# Patient Record
Sex: Male | Born: 1960 | Race: White | Hispanic: No | Marital: Married | Smoking: Former smoker
Health system: Southern US, Community
[De-identification: ages and names within clinical notes are randomized; demographics above are authoritative.]

## PROBLEM LIST (undated history)

## (undated) DIAGNOSIS — G20 Parkinson's disease: Secondary | ICD-10-CM

## (undated) DIAGNOSIS — G4719 Other hypersomnia: Secondary | ICD-10-CM

## (undated) DIAGNOSIS — M5442 Lumbago with sciatica, left side: Secondary | ICD-10-CM

## (undated) DIAGNOSIS — R251 Tremor, unspecified: Secondary | ICD-10-CM

## (undated) DIAGNOSIS — G4700 Insomnia, unspecified: Secondary | ICD-10-CM

## (undated) DIAGNOSIS — G20A1 Parkinson's disease without dyskinesia, without mention of fluctuations: Secondary | ICD-10-CM

## (undated) DIAGNOSIS — G2581 Restless legs syndrome: Secondary | ICD-10-CM

## (undated) DIAGNOSIS — G4733 Obstructive sleep apnea (adult) (pediatric): Secondary | ICD-10-CM

## (undated) HISTORY — DX: Obstructive sleep apnea (adult) (pediatric): G47.33

## (undated) HISTORY — DX: Parkinson's disease: G20

## (undated) HISTORY — DX: Other hypersomnia: G47.19

## (undated) HISTORY — DX: Restless legs syndrome: G25.81

## (undated) HISTORY — DX: Parkinson's disease without dyskinesia, without mention of fluctuations: G20.A1

## (undated) HISTORY — DX: Insomnia, unspecified: G47.00

## (undated) HISTORY — PX: APPENDECTOMY: SHX54

## (undated) HISTORY — DX: Tremor, unspecified: R25.1

## (undated) HISTORY — DX: Lumbago with sciatica, left side: M54.42

---

## 1995-02-26 HISTORY — PX: APPENDECTOMY: SHX54

## 2000-07-19 ENCOUNTER — Emergency Department (HOSPITAL_COMMUNITY): Admission: EM | Admit: 2000-07-19 | Discharge: 2000-07-19 | Payer: Self-pay | Admitting: *Deleted

## 2011-02-25 ENCOUNTER — Encounter: Payer: Self-pay | Admitting: Cardiovascular Disease

## 2011-06-10 ENCOUNTER — Emergency Department (HOSPITAL_COMMUNITY): Payer: Managed Care, Other (non HMO)

## 2011-06-10 ENCOUNTER — Emergency Department (HOSPITAL_COMMUNITY)
Admission: EM | Admit: 2011-06-10 | Discharge: 2011-06-10 | Disposition: A | Discharge status: Home / Self Care | Payer: Managed Care, Other (non HMO) | Attending: Emergency Medicine | Admitting: Emergency Medicine

## 2011-06-10 ENCOUNTER — Encounter (HOSPITAL_COMMUNITY): Payer: Self-pay

## 2011-06-10 DIAGNOSIS — R002 Palpitations: Secondary | ICD-10-CM | POA: Insufficient documentation

## 2011-06-10 DIAGNOSIS — R079 Chest pain, unspecified: Secondary | ICD-10-CM | POA: Insufficient documentation

## 2011-06-10 DIAGNOSIS — F17200 Nicotine dependence, unspecified, uncomplicated: Secondary | ICD-10-CM | POA: Insufficient documentation

## 2011-06-10 LAB — BASIC METABOLIC PANEL
BUN: 16 mg/dL (ref 6–23)
CO2: 26 mEq/L (ref 19–32)
Chloride: 104 mEq/L (ref 96–112)
Creatinine, Ser: 0.95 mg/dL (ref 0.50–1.35)
GFR calc Af Amer: >90 mL/min (ref >90)

## 2011-06-10 LAB — DIFFERENTIAL
Basophils Relative: 0 % (ref 0–1)
Monocytes Absolute: 0.5 10*3/uL (ref 0.1–1.0)
Monocytes Relative: 8 % (ref 3–12)
Neutro Abs: 4.5 10*3/uL (ref 1.7–7.7)

## 2011-06-10 LAB — CBC
HCT: 42.7 % (ref 39.0–52.0)
Hemoglobin: 15.3 g/dL (ref 13.0–17.0)
MCH: 30 pg (ref 26.0–34.0)
MCHC: 35.8 g/dL (ref 30.0–36.0)
MCV: 83.7 fL (ref 78.0–100.0)

## 2011-06-10 NOTE — Discharge Instructions (Signed)
Your ecg and blood tests do not show any signs of a heart attack or irregular heart beat.  Use tylenol or motrin for pain.  FOllow up with Dr. Tenny Craw for reevaluation. Call for appointment time. Return for worse symptoms.

## 2011-06-10 NOTE — ED Provider Notes (Addendum)
History     CSN: [id number]  Arrival date & time 06/10/11  1142   First MD Initiated Contact with Patient 06/10/11 1305      Chief Complaint  Patient presents with  . Chest Pain    (Consider location/radiation/quality/duration/timing/severity/associated sxs/prior treatment) HPI The patient is a 51 year old, male, with no past medical history presents to the emergency department complaining of left sided chest pain.  He says that it comes and goes, and fluctuates in severity.  It began while he was sitting at a computer.  He says occasionally it seems to radiate up into his neck.  However, he denies shortness of breath, nausea, vomiting, sweating.  He has not had a cough, fevers, chills, leg pain or swelling.  He does not take any medications.  He does not smoke cigarettes.  He states that he exercises aerobically 4-5 times per week and he does not get chest pain.  His pain is 1/10 at this time.  History reviewed. No pertinent past medical history.  History reviewed. No pertinent past surgical history.  No family history on file.  History  Substance Use Topics  . Smoking status: Current Some Day Smoker    Types: Cigars  . Smokeless tobacco: Not on file  . Alcohol Use: No      Review of Systems  Constitutional: Negative for fever, chills and diaphoresis.  Respiratory: Negative for cough and shortness of breath.   Cardiovascular: Positive for chest pain and palpitations. Negative for leg swelling.  Gastrointestinal: Negative for nausea and vomiting.  All other systems reviewed and are negative.    Allergies  Review of patient's allergies indicates no known allergies.  Home Medications   Current Outpatient Rx  Name Route Sig Dispense Refill  . RISAQUAD PO CAPS Oral Take 1 capsule by mouth daily.    Marland Kitchen MELATONIN 3 MG PO TABS Oral Take 3 mg by mouth at bedtime.    . MULTI-VITAMIN/MINERALS PO TABS Oral Take 1 tablet by mouth daily.      BP 139/91  Pulse 65  Temp(Src)  98.1 F (36.7 C) (Oral)  Resp 18  SpO2 95%  Physical Exam  Vitals reviewed. Constitutional: He is oriented to person, place, and time. He appears well-developed and well-nourished.  HENT:  Head: Normocephalic and atraumatic.  Eyes: Conjunctivae are normal.  Neck: Normal range of motion. Neck supple.       No carotid bruits  Cardiovascular: Normal rate.   No murmur heard. Pulmonary/Chest: Effort normal and breath sounds normal. No respiratory distress.  Abdominal: Soft. Bowel sounds are normal. There is no tenderness.  Musculoskeletal: Normal range of motion. He exhibits no edema.  Neurological: He is alert and oriented to person, place, and time.  Skin: Skin is warm and dry.  Psychiatric: He has a normal mood and affect. Thought content normal.    ED Course  Procedures (including critical care time) 51 year old, male, with no significant past medical history presents with left-sided chest pain, but no other symptoms.  Typically, associated with acute ACS.  We'll perform EKG, and laboratory testing, and chest x-ray, for evaluation because of his age and the pain.  However, I doubt that he is having in acute coronary syndrome.  At this time.   Labs Reviewed  CBC  DIFFERENTIAL  BASIC METABOLIC PANEL  POCT I-STAT TROPONIN I   Dg Chest Portable 1 View  06/10/2011  *RADIOLOGY REPORT*  Clinical Data: Left-sided chest pain  PORTABLE CHEST - 1 VIEW  Comparison: None.  Findings: Artifact overlies the chest.  Heart size is normal. Mediastinal shadows are normal.  Lungs are clear.  No effusions. No bony abnormalities.  IMPRESSION: No active disease  Original Report Authenticated By: Thomasenia Sales, M.D.    Date: 06/10/2011  Rate: 63  Rhythm: normal sinus rhythm  QRS Axis: normal  Intervals: normal  ST/T Wave abnormalities: normal  Conduction Disutrbances: none  Narrative Interpretation: unremarkable     2:11 PM I spoke with Dr. Tenny Craw.  He agreed with discharge.  He will see pt in  office.    I explained plan to pt.  He understands and agrees.  MDM  Chest pain No evidence of acute coronary syndrome, pulmonary disease, or other acute illness.        Cheri Guppy, MD 06/10/11 1428  Cheri Guppy, MD 06/10/11 [phone]

## 2011-06-10 NOTE — ED Notes (Signed)
Patient reports that he developed chestpain while sitting at desk 1 hour pta-weakness with radiation to back and jaw.

## 2011-09-19 ENCOUNTER — Other Ambulatory Visit: Payer: Self-pay | Admitting: Family Medicine

## 2011-09-19 DIAGNOSIS — R229 Localized swelling, mass and lump, unspecified: Secondary | ICD-10-CM

## 2011-09-20 ENCOUNTER — Ambulatory Visit
Admission: RE | Admit: 2011-09-20 | Discharge: 2011-09-20 | Disposition: A | Discharge status: Home / Self Care | Payer: Managed Care, Other (non HMO) | Source: Ambulatory Visit | Attending: Family Medicine | Admitting: Family Medicine

## 2011-09-20 DIAGNOSIS — R229 Localized swelling, mass and lump, unspecified: Secondary | ICD-10-CM

## 2011-09-27 ENCOUNTER — Ambulatory Visit
Admission: RE | Admit: 2011-09-27 | Discharge: 2011-09-27 | Disposition: A | Discharge status: Home / Self Care | Payer: Managed Care, Other (non HMO) | Source: Ambulatory Visit | Attending: Family Medicine | Admitting: Family Medicine

## 2011-09-27 ENCOUNTER — Other Ambulatory Visit: Payer: Self-pay | Admitting: Family Medicine

## 2011-09-27 DIAGNOSIS — R229 Localized swelling, mass and lump, unspecified: Secondary | ICD-10-CM

## 2011-09-27 MED ORDER — IOHEXOL 300 MG/ML  SOLN
100.0000 mL | Freq: Once | INTRAMUSCULAR | Status: AC | PRN
  Administered 2011-09-27: 100 mL via INTRAVENOUS

## 2013-09-08 ENCOUNTER — Encounter (INDEPENDENT_AMBULATORY_CARE_PROVIDER_SITE_OTHER): Payer: Self-pay

## 2013-09-08 ENCOUNTER — Ambulatory Visit
Admission: RE | Admit: 2013-09-08 | Discharge: 2013-09-08 | Disposition: A | Discharge status: Home / Self Care | Payer: 59 | Source: Ambulatory Visit | Attending: Family Medicine | Admitting: Family Medicine

## 2013-09-08 ENCOUNTER — Other Ambulatory Visit: Payer: Self-pay | Admitting: Family Medicine

## 2013-09-08 DIAGNOSIS — M542 Cervicalgia: Secondary | ICD-10-CM

## 2013-09-13 ENCOUNTER — Other Ambulatory Visit: Payer: Self-pay | Admitting: Family Medicine

## 2013-09-13 DIAGNOSIS — M542 Cervicalgia: Secondary | ICD-10-CM

## 2013-09-17 ENCOUNTER — Ambulatory Visit
Admission: RE | Admit: 2013-09-17 | Discharge: 2013-09-17 | Disposition: A | Discharge status: Home / Self Care | Payer: 59 | Source: Ambulatory Visit | Attending: Family Medicine | Admitting: Family Medicine

## 2013-09-17 DIAGNOSIS — M542 Cervicalgia: Secondary | ICD-10-CM

## 2014-05-12 ENCOUNTER — Encounter (HOSPITAL_BASED_OUTPATIENT_CLINIC_OR_DEPARTMENT_OTHER): Payer: Self-pay | Admitting: *Deleted

## 2014-05-12 ENCOUNTER — Emergency Department: Payer: 59

## 2014-05-12 ENCOUNTER — Emergency Department (HOSPITAL_BASED_OUTPATIENT_CLINIC_OR_DEPARTMENT_OTHER): Payer: 59

## 2014-05-12 ENCOUNTER — Emergency Department (HOSPITAL_BASED_OUTPATIENT_CLINIC_OR_DEPARTMENT_OTHER)
Admission: EM | Admit: 2014-05-12 | Discharge: 2014-05-13 | Disposition: A | Discharge status: Home / Self Care | Payer: 59 | Attending: Emergency Medicine | Admitting: Emergency Medicine

## 2014-05-12 DIAGNOSIS — R42 Dizziness and giddiness: Secondary | ICD-10-CM | POA: Diagnosis not present

## 2014-05-12 LAB — BASIC METABOLIC PANEL
ANION GAP: 8 (ref 5–15)
BUN: 20 mg/dL (ref 6–23)
CO2: 28 mmol/L (ref 19–32)
CREATININE: 0.94 mg/dL (ref 0.50–1.35)
Calcium: 9 mg/dL (ref 8.4–10.5)
Chloride: 106 mmol/L (ref 96–112)
GFR calc Af Amer: >90 mL/min (ref >90)
GLUCOSE: 108 mg/dL — AB (ref 70–99)
Potassium: 3.4 mmol/L — ABNORMAL LOW (ref 3.5–5.1)
SODIUM: 142 mmol/L (ref 135–145)

## 2014-05-12 LAB — CBC WITH DIFFERENTIAL/PLATELET
BASOS PCT: 0 % (ref 0–1)
Basophils Absolute: 0 10*3/uL (ref 0.0–0.1)
EOS ABS: 0.1 10*3/uL (ref 0.0–0.7)
EOS PCT: 2 % (ref 0–5)
HCT: 41 % (ref 39.0–52.0)
HEMOGLOBIN: 14.7 g/dL (ref 13.0–17.0)
Lymphocytes Relative: 25 % (ref 12–46)
Lymphs Abs: 1.7 10*3/uL (ref 0.7–4.0)
MCH: 30.4 pg (ref 26.0–34.0)
MCHC: 35.9 g/dL (ref 30.0–36.0)
MCV: 84.7 fL (ref 78.0–100.0)
MONO ABS: 0.5 10*3/uL (ref 0.1–1.0)
Monocytes Relative: 8 % (ref 3–12)
NEUTROS PCT: 65 % (ref 43–77)
Neutro Abs: 4.5 10*3/uL (ref 1.7–7.7)
Platelets: 238 10*3/uL (ref 150–400)
RBC: 4.84 MIL/uL (ref 4.22–5.81)
RDW: 12.8 % (ref 11.5–15.5)
WBC: 6.9 10*3/uL (ref 4.0–10.5)

## 2014-05-12 LAB — TROPONIN I: Troponin I: <0.03 ng/mL (ref <0.031)

## 2014-05-12 MED ORDER — MECLIZINE HCL 25 MG PO TABS
25.0000 mg | ORAL_TABLET | Freq: Once | ORAL | Status: AC
  Administered 2014-05-12: 25 mg via ORAL
  Filled 2014-05-12: qty 1

## 2014-05-12 NOTE — ED Notes (Signed)
Pt placed on heart monitor.

## 2014-05-12 NOTE — ED Notes (Signed)
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

## 2014-05-12 NOTE — ED Provider Notes (Signed)
CSN: [id number]     Arrival date & time 05/12/14  1608 History  This chart was scribed for Rolan BuccoMelanie Lars Jeziorski, MD by Evon Slackerrance Branch, ED Scribe. This patient was seen in room MH03/MH03 and the patient's care was started at 6:35 PM.      Chief Complaint  Patient presents with  . Dizziness   Patient is a 54 y.o. male presenting with dizziness. The history is provided by the patient. No language interpreter was used.  Dizziness Associated symptoms: headaches and nausea   Associated symptoms: no blood in stool, no chest pain, no diarrhea, no shortness of breath, no vomiting and no weakness    HPI Comments: Donnamarie Rossettihomas J Fritsch is a 54 y.o. male who presents to the Emergency Department complaining of new dizziness onset today at 2:30 PM. Pt describes the dizziness as the room as spinning and an off balance feeling. Pt states that he has an slight occipital and parietal HA and slight nausea. Pt states that he had some slight neck pain described as a discomfort/ 2 days that resolved on its own. 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 Pt states that turning his head or standing makes the dizziness worse. Pt states that he has had some facial twitching to the right face that has been going off on for the last few days, but he has had that before during stressful situations.  Pt denies falls or head injuries. Pt denies numbness, vision changes or vomiting. Pt states that he has a Hx of seasonal allergies. Denies being on blood thinners.   History reviewed. No pertinent past medical history. Past Surgical History  Procedure Laterality Date  . Appendectomy     No family history on file. History  Substance Use Topics  . Smoking status: Current Some Day Smoker    Types: Cigars  . Smokeless tobacco: Current User    Types: Snuff  . Alcohol Use: No    Review of Systems  Constitutional: Negative for fever, chills, diaphoresis and fatigue.  HENT: Negative for congestion, rhinorrhea and sneezing.    Eyes: Negative.  Negative for visual disturbance.  Respiratory: Negative for cough, chest tightness and shortness of breath.   Cardiovascular: Negative for chest pain and leg swelling.  Gastrointestinal: Positive for nausea. Negative for vomiting, abdominal pain, diarrhea and blood in stool.  Genitourinary: Negative for frequency, hematuria, flank pain and difficulty urinating.  Musculoskeletal: Negative for back pain and arthralgias.  Skin: Negative for rash.  Neurological: Positive for dizziness and headaches. Negative for speech difficulty, weakness and numbness.  All other systems reviewed and are negative.   Allergies  Review of patient's allergies indicates no known allergies.  Home Medications   Prior to Admission medications   Medication Sig Start Date End Date Taking? Authorizing Provider  naproxen sodium (ANAPROX) 220 MG tablet Take 220 mg by mouth as needed.   Yes Historical Provider, MD  acidophilus (RISAQUAD) CAPS Take 1 capsule by mouth daily.    Historical Provider, MD  Melatonin 3 MG TABS Take 3 mg by mouth at bedtime.    Historical Provider, MD  Multiple Vitamins-Minerals (MULTIVITAMIN WITH MINERALS) tablet Take 1 tablet by mouth daily.    Historical Provider, MD   BP 135/79 mmHg  Pulse 72  Temp(Src) 98.4 F (36.9 C) (Oral)  Resp 18  Ht 5\' 9"  (1.753 m)  Wt 190 lb (86.183 kg)  BMI 28.05 kg/m2  SpO2 99%   Physical Exam  Constitutional: He is oriented to person, place,  and time. He appears well-developed and well-nourished.  HENT:  Head: Normocephalic and atraumatic.  Right Ear: External ear normal.  Left Ear: External ear normal.  Mouth/Throat: Oropharynx is clear and moist.  Eyes: Pupils are equal, round, and reactive to light.  Positive horizontal nystagmus with fast component to the left. No vertical or rotational nystagmus  Neck: Normal range of motion. Neck supple.  Cardiovascular: Normal rate, regular rhythm and normal heart sounds.    Pulmonary/Chest: Effort normal and breath sounds normal. No respiratory distress. He has no wheezes. He has no rales. He exhibits no tenderness.  Abdominal: Soft. Bowel sounds are normal. There is no tenderness. There is no rebound and no guarding.  Musculoskeletal: Normal range of motion. He exhibits no edema.  Lymphadenopathy:    He has no cervical adenopathy.  Neurological: He is alert and oriented to person, place, and time.  Motor 5 out of 5 all extremities, sensation grossly intact to light touch all extremities, finger-to-nose intact, no pronator drift, CN 2 through 12 grossly intact  Skin: Skin is warm and dry. No rash noted.  Psychiatric: He has a normal mood and affect.    ED Course  Procedures (including critical care time) DIAGNOSTIC STUDIES: Oxygen Saturation is 99% on RA, normal by my interpretation.    COORDINATION OF CARE: 6:46 PM-Discussed treatment plan with pt at bedside and pt agreed to plan.     Labs Review Labs Reviewed  BASIC METABOLIC PANEL - Abnormal; Notable for the following:    Potassium 3.4 (*)    Glucose, Bld 108 (*)    All other components within normal limits  CBC WITH DIFFERENTIAL/PLATELET  TROPONIN I    Imaging Review Dg Chest 2 View  05/12/2014   CLINICAL DATA:  Dizziness today, history of tobacco use.  EXAM: CHEST  2 VIEW  COMPARISON:  Portable chest x-ray of June 10, 2011.  FINDINGS: The lungs are well-expanded and clear. The heart and pulmonary vascularity are normal. The mediastinum is normal in width. There is no pleural effusion or pneumothorax. The bony thorax is unremarkable.  IMPRESSION: There is no active cardiopulmonary disease.   Electronically Signed   By: David  [place]   On: 05/12/2014 17:30   Ct Head Wo Contrast  05/12/2014   CLINICAL DATA:  Dizziness today.  EXAM: CT HEAD WITHOUT CONTRAST  TECHNIQUE: Contiguous axial images were obtained from the base of the skull through the vertex without intravenous contrast.  COMPARISON:   None.  FINDINGS: Normal appearing cerebral hemispheres and posterior fossa structures. Normal size and position of the ventricles. No intracranial hemorrhage, mass lesion or CT evidence of acute infarction. Unremarkable bones and included paranasal sinuses.  IMPRESSION: Normal examination.   Electronically Signed   By: Beckie Salts M.D.   On: 05/12/2014 19:43     EKG Interpretation   Date/Time:  Thursday May 12 2014 16:22:01 EDT Ventricular Rate:  66 PR Interval:  162 QRS Duration: 94 QT Interval:  408 QTC Calculation: 427 R Axis:   18 Text Interpretation:  Normal sinus rhythm Normal ECG since last tracing no  significant change Confirmed by Kyerra Vargo  MD, Cecylia Brazill ([id number]) on 05/12/2014  4:37:45 PM      MDM   Final diagnoses:  Dizziness   Patient presents with vertigo symptoms. He also has a posterior and left-sided parietal headache. He reports some intermittent twitching to the right side of his face although he's had that symptom in the past. He currently has no neurologic deficits other  than some ataxia associated with the dizziness. His symptoms have improved with meclizine. However given his ongoing headache, I feel that he needs an MRI to rule out a posterior circulation stroke. I discussed the findings with the patient and he is amenable to being transferred to [facility] for MRI. I spoke with Dr. Hyacinth Meeker who is accepted the patient for transfer to the ED at [facility] to have the MRI. If the MRI is negative, he can likely be discharged with meclizine for symptomatic reflief.   I personally performed the services described in this documentation, which was scribed in my presence.  The recorded information has been reviewed and considered.      Rolan Bucco, MD 05/12/14 2018

## 2014-05-13 ENCOUNTER — Emergency Department (HOSPITAL_COMMUNITY): Payer: 59

## 2014-05-13 MED ORDER — ONDANSETRON HCL 4 MG PO TABS: 4.0000 mg | ORAL_TABLET | Freq: Four times a day (QID) | ORAL | Status: DC

## 2014-05-13 MED ORDER — MECLIZINE HCL 25 MG PO TABS: 25.0000 mg | ORAL_TABLET | Freq: Three times a day (TID) | ORAL | Status: DC | PRN

## 2014-05-13 NOTE — ED Notes (Signed)
Pt returned from MRI °

## 2014-05-13 NOTE — Discharge Instructions (Signed)
Benign Positional Vertigo Vertigo means you feel like you or your surroundings are moving when they are not. Benign positional vertigo is the most common form of vertigo. Benign means that the cause of your condition is not serious. Benign positional vertigo is more common in older adults. CAUSES  Benign positional vertigo is the result of an upset in the labyrinth system. This is an area in the middle ear that helps control your balance. This may be caused by a viral infection, head injury, or repetitive motion. However, often no specific cause is found. SYMPTOMS  Symptoms of benign positional vertigo occur when you move your head or eyes in different directions. Some of the symptoms may include:  Loss of balance and falls.  Vomiting.  Blurred vision.  Dizziness.  Nausea.  Involuntary eye movements (nystagmus). DIAGNOSIS  Benign positional vertigo is usually diagnosed by physical exam. If the specific cause of your benign positional vertigo is unknown, your caregiver may perform imaging tests, such as magnetic resonance imaging (MRI) or computed tomography (CT). TREATMENT  Your caregiver may recommend movements or procedures to correct the benign positional vertigo. Medicines such as meclizine, benzodiazepines, and medicines for nausea may be used to treat your symptoms. In rare cases, if your symptoms are caused by certain conditions that affect the inner ear, you may need surgery. HOME CARE INSTRUCTIONS   Follow your caregiver's instructions.  Move slowly. Do not make sudden body or head movements.  Avoid driving.  Avoid operating heavy machinery.  Avoid performing any tasks that would be dangerous to you or others during a vertigo episode.  Drink enough fluids to keep your urine clear or pale yellow. SEEK IMMEDIATE MEDICAL CARE IF:   You develop problems with walking, weakness, numbness, or using your arms, hands, or legs.  You have difficulty speaking.  You develop  severe headaches.  Your nausea or vomiting continues or gets worse.  You develop visual changes.  Your family or friends notice any behavioral changes.  Your condition gets worse.  You have a fever.  You develop a stiff neck or sensitivity to light. MAKE SURE YOU:   Understand these instructions.  Will watch your condition.  Will get help right away if you are not doing well or get worse. Document Released: 11/19/2005 Document Revised: 05/06/2011 Document Reviewed: 11/01/2010 Exit[employer] Patient Information 2015 [employer]. This information is not intended to replace advice given to you by your health care provider. Make sure you discuss any questions you have with your health care provider.    

## 2014-05-13 NOTE — ED Notes (Signed)
Patient transported to MRI 

## 2014-05-13 NOTE — ED Provider Notes (Signed)
CSN: [id number]     Arrival date & time 05/12/14  1608 History   First MD Initiated Contact with Patient 05/12/14 1829     Chief Complaint  Patient presents with  . Dizziness     (Consider location/radiation/quality/duration/timing/severity/associated sxs/prior Treatment) Patient is a 54 y.o. male presenting with dizziness. The history is provided by the patient. No language interpreter was used.  Dizziness Quality:  Head spinning and imbalance Severity:  Severe Onset quality:  Sudden Chronicity:  New Associated symptoms: no headaches   Associated symptoms comment:  Sudden onset today of dizziness and feeling off balance when walking. No fall or injury. He denies nausea or vomiting. No headache. No history of vertigo in the past. He denies chest pain, SOB, recent illness. The dizziness is worse when he stands or tries to walk.    History reviewed. No pertinent past medical history. Past Surgical History  Procedure Laterality Date  . Appendectomy     No family history on file. History  Substance Use Topics  . Smoking status: Current Some Day Smoker    Types: Cigars  . Smokeless tobacco: Current User    Types: Snuff  . Alcohol Use: No    Review of Systems  Constitutional: Negative for fever and chills.  HENT: Negative.   Respiratory: Negative.   Cardiovascular: Negative.   Gastrointestinal: Negative.   Musculoskeletal: Negative.   Skin: Negative.   Neurological: Positive for dizziness. Negative for syncope and headaches.      Allergies  Review of patient's allergies indicates no known allergies.  Home Medications   Prior to Admission medications   Medication Sig Start Date End Date Taking? Authorizing Provider  naproxen sodium (ANAPROX) 220 MG tablet Take 220 mg by mouth as needed.   Yes Historical Provider, MD  acidophilus (RISAQUAD) CAPS Take 1 capsule by mouth daily.    Historical Provider, MD  Melatonin 3 MG TABS Take 3 mg by mouth at bedtime.    Historical  Provider, MD  Multiple Vitamins-Minerals (MULTIVITAMIN WITH MINERALS) tablet Take 1 tablet by mouth daily.    Historical Provider, MD   BP 125/79 mmHg  Pulse 70  Temp(Src) 98.1 F (36.7 C) (Oral)  Resp 18  Ht  (1.753 m)  Wt 190 lb (86.183 kg)  BMI 28.05 kg/m2  SpO2 97% Physical Exam  Constitutional: He is oriented to person, place, and time. He appears well-developed and well-nourished.  HENT:  Head: Normocephalic and atraumatic.  Eyes: EOM are normal. Pupils are equal, round, and reactive to light.  Neck: Normal range of motion.  Cardiovascular: Normal rate and regular rhythm.   No murmur heard. Pulmonary/Chest: Effort normal and breath sounds normal. He has no wheezes. He has no rales.  Abdominal: Soft. There is no tenderness.  Musculoskeletal: He exhibits no edema.  Neurological: He is alert and oriented to person, place, and time. He has normal strength and normal reflexes. No sensory deficit. He displays a negative Romberg sign.  CN's 3-12 grossly intact. No facial asymmetry or lateralizing weakness. No deficits of coordination.   Skin: Skin is warm and dry.  Psychiatric: He has a normal mood and affect.    ED Course  Procedures (including critical care time) Labs Review Labs Reviewed  BASIC METABOLIC PANEL - Abnormal; Notable for the following:    Potassium 3.4 (*)    Glucose, Bld 108 (*)    All other components within normal limits  CBC WITH DIFFERENTIAL/PLATELET  TROPONIN I    Imaging Review Dg  Chest 2 View  05/12/2014   CLINICAL DATA:  Dizziness today, history of tobacco use.  EXAM: CHEST  2 VIEW  COMPARISON:  Portable chest x-ray of June 10, 2011.  FINDINGS: The lungs are well-expanded and clear. The heart and pulmonary vascularity are normal. The mediastinum is normal in width. There is no pleural effusion or pneumothorax. The bony thorax is unremarkable.  IMPRESSION: There is no active cardiopulmonary disease.   Electronically Signed   By: David  [place]    On: 05/12/2014 17:30   Ct Head Wo Contrast  05/12/2014   CLINICAL DATA:  Dizziness today.  EXAM: CT HEAD WITHOUT CONTRAST  TECHNIQUE: Contiguous axial images were obtained from the base of the skull through the vertex without intravenous contrast.  COMPARISON:  None.  FINDINGS: Normal appearing cerebral hemispheres and posterior fossa structures. Normal size and position of the ventricles. No intracranial hemorrhage, mass lesion or CT evidence of acute infarction. Unremarkable bones and included paranasal sinuses.  IMPRESSION: Normal examination.   Electronically Signed   By: Beckie SaltsSteven  Reid M.D.   On: 05/12/2014 19:43     EKG Interpretation   Date/Time:  Thursday May 12 2014 16:22:01 EDT Ventricular Rate:  66 PR Interval:  162 QRS Duration: 94 QT Interval:  408 QTC Calculation: 427 R Axis:   18 Text Interpretation:  Normal sinus rhythm Normal ECG since last tracing no  significant change Confirmed by BELFI  MD, MELANIE ([id number]) on 05/12/2014  4:37:45 PM      MDM   Final diagnoses:  Dizziness    1. Vertigo  Patient sent by Dr. Fredderick PhenixBelfi after [facility] to r/o stroke. He reports his symptoms are improved with Meclizine.   MRI performed and does not show any acute infarct. He is stable. Has been ambulatory. No symptoms of nausea. Will discharge home with PCP follow up with Rx meclizine.    Elpidio AnisShari Nurah Petrides, PA-C 05/14/14 0015  Samuel JesterKathleen McManus, DO 05/15/14 [phone]

## 2014-12-05 ENCOUNTER — Ambulatory Visit: Payer: Self-pay | Admitting: Pediatrics

## 2014-12-19 ENCOUNTER — Encounter: Payer: Self-pay | Admitting: Pediatrics

## 2014-12-19 ENCOUNTER — Ambulatory Visit: Payer: Self-pay | Admitting: Pediatrics

## 2014-12-19 ENCOUNTER — Ambulatory Visit (INDEPENDENT_AMBULATORY_CARE_PROVIDER_SITE_OTHER): Payer: 59 | Admitting: Pediatrics

## 2014-12-19 VITALS — BP 110/78 | HR 80 | Temp 98.3°F | Resp 20 | Ht 68.5 in | Wt 189.6 lb

## 2014-12-19 DIAGNOSIS — J3089 Other allergic rhinitis: Secondary | ICD-10-CM | POA: Diagnosis not present

## 2014-12-19 DIAGNOSIS — Z9989 Dependence on other enabling machines and devices: Principal | ICD-10-CM

## 2014-12-19 DIAGNOSIS — G4733 Obstructive sleep apnea (adult) (pediatric): Secondary | ICD-10-CM | POA: Diagnosis not present

## 2014-12-19 DIAGNOSIS — R42 Dizziness and giddiness: Secondary | ICD-10-CM | POA: Diagnosis not present

## 2014-12-19 NOTE — Progress Notes (Signed)
  [address] [place] [address] Dept: [phone]  New Patient Note  Patient ID: Alex Meyer, male    DOB: 1961-02-20  Age: 54 y.o. MRN: [id number] Date of Office Visit: 12/19/2014 Referring provider: No referring provider defined for this encounter.    Chief Complaint: Nasal Congestion and Dizziness  HPI Alex Meyer presents for evaluation of vertigo since March of this year. In addition he has had a stuffy nose for the past 2 years which is worse when he is lying down. He has aggravation of his nasal congestion exposure to large amounts of dust. He has not had headaches. He does have some hearing loss. He has had obstructive sleep apnea for 14 years requiring CPAP. He comes in for allergy testing. At one time fluticasone helped his nasal congestion, but it stopped working after one year.   Review of Systems  Constitutional: Negative.   HENT:       Vertigo since March of this year. He is better but still having some difficulties. No headaches. He has decreased hearing. Nasal congestion when lying down for about 2 years. Obstructive sleep apnea requiring CPAP for 14 years  Eyes: Negative.   Respiratory: Negative.   Cardiovascular: Negative.   Gastrointestinal: Negative.   Genitourinary: Negative.   Musculoskeletal: Negative.   Skin: Negative.   Neurological: Negative.   Endo/Heme/Allergies: Negative.   Psychiatric/Behavioral: Negative.      Drug Allergies:  No Known Allergies  Family History: Dre's family history includes Allergic rhinitis in his father. There is no history of Asthma, Angioedema, Eczema, Immunodeficiency, or Urticaria.Marland Kitchen.  Physical Exam: BP 110/78 mmHg  Pulse 80  Temp(Src) 98.3 F (36.8 C) (Oral)  Resp 20  Ht 5' 8.5" (1.74 m)  Wt 189 lb 9.5 oz (86 kg)  BMI 28.41 kg/m2   Physical Exam  Constitutional: He is oriented to person, place, and time. He appears well-developed and well-nourished.  HENT:  Eyes normal. Ears normal with  normal tympanic membranes. Nose mild swelling of nasal turbinates. Pharynx normal  Neck: Neck supple. No thyromegaly present.  Cardiovascular:  S1 and S2 normal no murmurs  Pulmonary/Chest:  Clear to percussion and auscultation  Abdominal: Soft.  No hepatosplenomegaly  Lymphadenopathy:    He has no cervical adenopathy.  Neurological: He is alert and oriented to person, place, and time.  Skin:  Clear  Psychiatric: He has a normal mood and affect. His behavior is normal. Judgment and thought content normal.  Vitals reviewed.   Diagnostics:  Allergy skin testing showed slight reactivity to weeds and to common indoor molds.  Assessment Assessment and Plan: 1. Obstructive sleep apnea treated with continuous positive airway pressure (CPAP)   2. Vertigo   3. Other allergic rhinitis         Patient Instructions  Meclizine 25 mg 3 times a day if needed for dizziness. He was warned regarding drowsiness Loratadine 10 mg once a day for runny nose. Rhinocort one or 2 sprays per nostril at night for stuffy nose. Use about 2 hours before needing CPAP. Environmental controls dust and mold.  He should see an ENT specialist regarding his vertigo    Return in about 6 weeks (around 01/30/2015).   Thank you for the opportunity to care for this patient.  Please do not hesitate to contact me with questions.  Tonette BihariJ. A. Oluwatomiwa Kinyon, M.D.  [facility] [address] [place], [place] [address] [phone]

## 2014-12-19 NOTE — Patient Instructions (Addendum)
Meclizine 25 mg 3 times a day if needed for dizziness. He was warned regarding drowsiness Loratadine 10 mg once a day for runny nose. Rhinocort one or 2 sprays per nostril at night for stuffy nose. Use about 2 hours before needing CPAP. Environmental controls dust and mold.  He should see an ENT specialist regarding his vertigo

## 2015-09-25 ENCOUNTER — Telehealth: Payer: Self-pay | Admitting: Pediatrics

## 2015-09-25 NOTE — Telephone Encounter (Signed)
Patient called just to say he received a bill for services from last fall and he just wanted to let you know he will be paying on it.

## 2016-09-20 DIAGNOSIS — Z0000 Encounter for general adult medical examination without abnormal findings: Secondary | ICD-10-CM | POA: Diagnosis not present

## 2016-09-26 IMAGING — CR DG CHEST 2V
2 series · 2 of 2 positions shown · non-contrast
Comparison: Portable chest x-ray June 10, 2011.

CLINICAL DATA: Dizziness today, history of tobacco use.

EXAM:
CHEST  2 VIEW

[w chest pa]
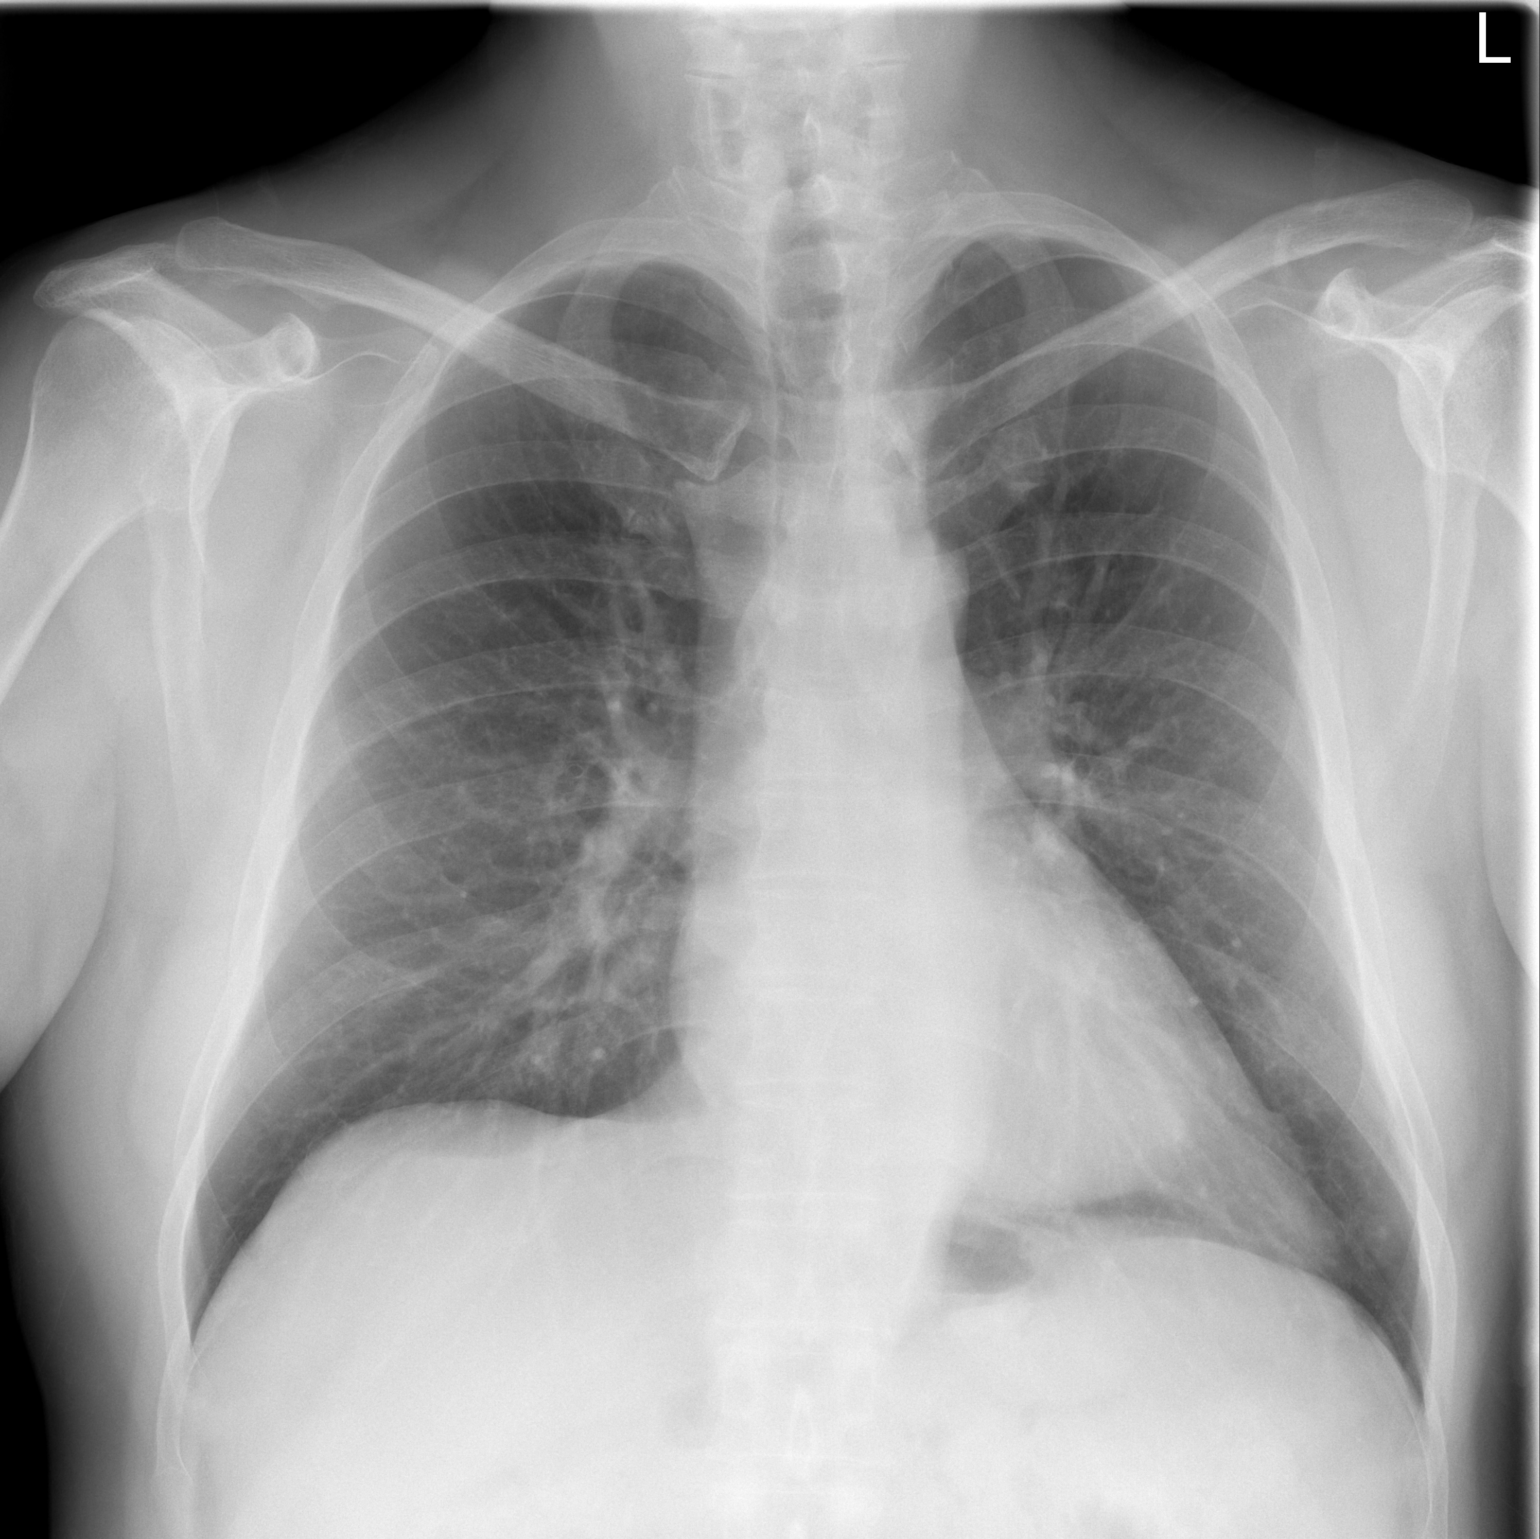

[w chest lat]
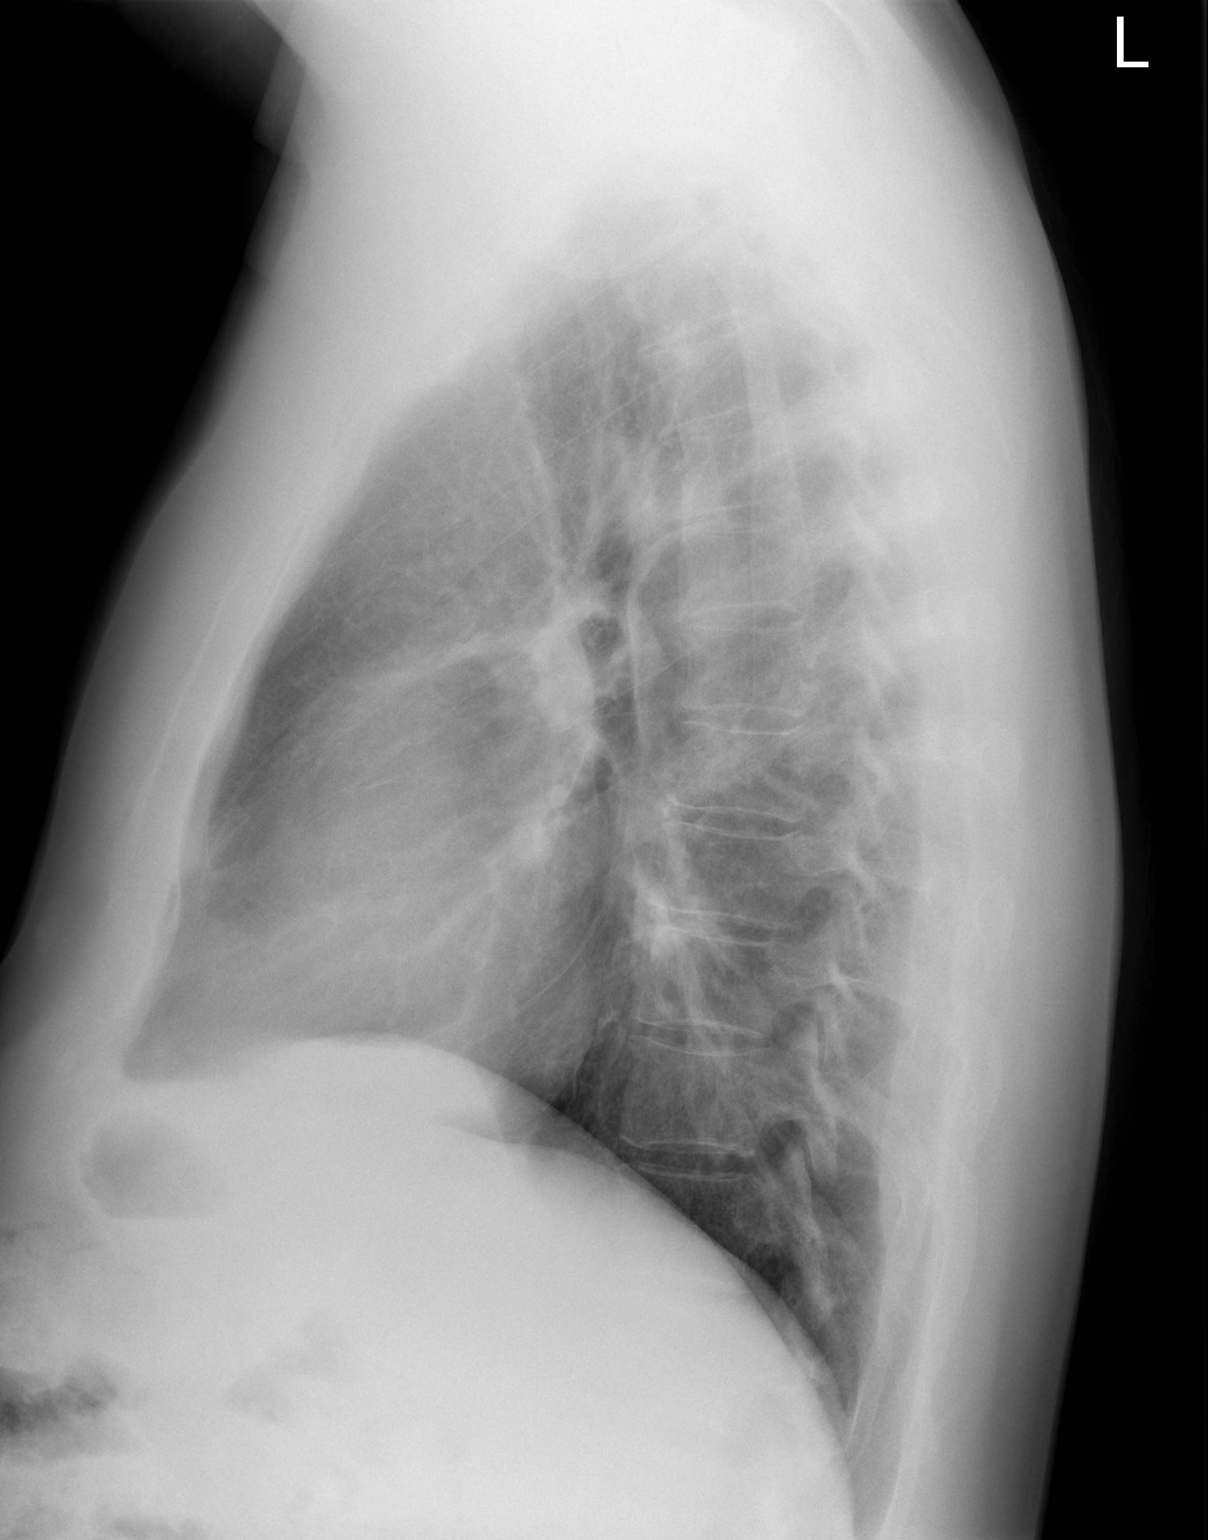

[2 of 2 positions shown; findings below may reference images not displayed]

FINDINGS: The lungs are well-expanded and clear. The heart and pulmonary
vascularity are normal. The mediastinum is normal in width. There is
no pleural effusion or pneumothorax. The bony thorax is
unremarkable.
IMPRESSION: There is no active cardiopulmonary disease.

## 2017-03-06 DIAGNOSIS — H532 Diplopia: Secondary | ICD-10-CM | POA: Diagnosis not present

## 2017-03-06 DIAGNOSIS — R110 Nausea: Secondary | ICD-10-CM | POA: Diagnosis not present

## 2017-03-06 DIAGNOSIS — R5383 Other fatigue: Secondary | ICD-10-CM | POA: Diagnosis not present

## 2018-01-12 DIAGNOSIS — G4733 Obstructive sleep apnea (adult) (pediatric): Secondary | ICD-10-CM | POA: Diagnosis not present

## 2018-01-12 DIAGNOSIS — G4721 Circadian rhythm sleep disorder, delayed sleep phase type: Secondary | ICD-10-CM | POA: Diagnosis not present

## 2018-01-28 DIAGNOSIS — G4733 Obstructive sleep apnea (adult) (pediatric): Secondary | ICD-10-CM | POA: Diagnosis not present

## 2018-02-12 DIAGNOSIS — G4733 Obstructive sleep apnea (adult) (pediatric): Secondary | ICD-10-CM | POA: Diagnosis not present

## 2018-03-15 DIAGNOSIS — G4733 Obstructive sleep apnea (adult) (pediatric): Secondary | ICD-10-CM | POA: Diagnosis not present

## 2018-04-15 DIAGNOSIS — G4733 Obstructive sleep apnea (adult) (pediatric): Secondary | ICD-10-CM | POA: Diagnosis not present

## 2018-11-05 ENCOUNTER — Encounter: Payer: Self-pay | Admitting: *Deleted

## 2018-11-09 ENCOUNTER — Encounter: Payer: Self-pay | Admitting: Diagnostic Neuroimaging

## 2018-11-09 ENCOUNTER — Other Ambulatory Visit: Payer: Self-pay

## 2018-11-09 ENCOUNTER — Ambulatory Visit (INDEPENDENT_AMBULATORY_CARE_PROVIDER_SITE_OTHER): Payer: 59 | Admitting: Diagnostic Neuroimaging

## 2018-11-09 VITALS — BP 140/97 | HR 78 | Temp 98.0°F | Ht 70.0 in | Wt 203.8 lb

## 2018-11-09 DIAGNOSIS — R251 Tremor, unspecified: Secondary | ICD-10-CM

## 2018-11-09 MED ORDER — CARBIDOPA-LEVODOPA 25-100 MG PO TABS: 1.0000 | ORAL_TABLET | Freq: Three times a day (TID) | ORAL | 12 refills | Status: DC

## 2018-11-09 NOTE — Patient Instructions (Signed)
TREMOR  - check MRI brain   - check labs  - carbidopa/levodopa (25/100) half tab three times a day with meals; after 2 weeks increase to 1 tab three times a day with meals

## 2018-11-09 NOTE — Addendum Note (Signed)
Addended by: Inis Sizer D on: 11/09/2018 04:47 PM   Modules accepted: Orders

## 2018-11-09 NOTE — Progress Notes (Signed)
[facility]  PATIENT: Alex Meyer J Bartolo DOB: 22-Feb-1961  REFERRING CLINICIAN: A Brake HISTORY FROM: patient  REASON FOR VISIT: new consult    HISTORICAL  CHIEF COMPLAINT:  Chief Complaint  Patient presents with  . Tremors    rm 7 New Pt, "left leg tremor 12/2016, left arm/hand 05/2018"    HISTORY OF PRESENT ILLNESS:   58 year old male here for evaluation of tremor.  2018 patient noticed resting tremor in left foot and leg.  Over the past years this is progressed to his left arm and left hand.  Symptoms occur mainly at rest when he is not paying attention to it.  Symptoms worse with caffeine and stress.  Patient has family history of tremor in his father.  No change in voice, speech or swallowing.  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  Patient has some low back pain radiating to the right or left legs.  He has noticed some mild balance issues.  No change in smell or taste.  He has some mild constipation.   REVIEW OF SYSTEMS: Full 14 system review of systems performed and negative with exception of: As per HPI.  ALLERGIES: No Known Allergies  HOME MEDICATIONS: Outpatient Medications Prior to Visit  Medication Sig Dispense Refill  . acidophilus (RISAQUAD) CAPS Take 1 capsule by mouth daily.    . Melatonin 5 MG TABS Take 5 mg by mouth at bedtime.    . Multiple Vitamins-Minerals (MULTIVITAMIN WITH MINERALS) tablet Take 1 tablet by mouth daily.    . naproxen sodium (ANAPROX) 220 MG tablet Take 220 mg by mouth as needed.    . meclizine (ANTIVERT) 25 MG tablet Take 1 tablet (25 mg total) by mouth 3 (three) times daily as needed. (Patient not taking: Reported on 12/19/2014) 21 tablet 0  . Melatonin 3 MG TABS Take 3 mg by mouth at bedtime.    . ondansetron (ZOFRAN) 4 MG tablet Take 1 tablet (4 mg total) by mouth every 6 (six) hours. (Patient not taking: Reported on 12/19/2014) 12 tablet 0   No  facility-administered medications prior to visit.     PAST MEDICAL HISTORY: Past Medical History:  Diagnosis Date  . Excessive daytime sleepiness   . Insomnia   . Lumbago with sciatica, left side   . OSA on CPAP   . RLS (restless legs syndrome)    pt denies  . Tremor     PAST SURGICAL HISTORY: Past Surgical History:  Procedure Laterality Date  . APPENDECTOMY  1997    FAMILY HISTORY: Family History  Problem Relation Age of Onset  . Allergic rhinitis Father   . Heart failure Mother   . Sleep apnea Brother   . Colon cancer Maternal Grandfather   . Asthma Neg Hx   . Angioedema Neg Hx   . Eczema Neg Hx   . Immunodeficiency Neg Hx   . Urticaria Neg Hx     SOCIAL HISTORY: Social History   Socioeconomic History  . Marital status: Married    Spouse name: Vernona RiegerLaura  . Number of children: 3  . Years of education: Not on file  . Highest education level: Bachelor's degree (e.g., BA, AB, BS)  Occupational History  . Not on file  Social Needs  . Financial resource strain: Not on file  . Food insecurity    Worry: Not on file    Inability: Not on file  . Transportation needs    Medical:  Not on file    Non-medical: Not on file  Tobacco Use  . Smoking status: Former Smoker    Types: Cigars    Quit date: 11/08/2016    Years since quitting: 2.0  . Smokeless tobacco: Former Systems developer    Types: Snuff    Quit date: 11/08/2016  . Tobacco comment: 3/week  Substance and Sexual Activity  . Alcohol use: No    Comment: former dependence, none since 2010  . Drug use: No  . Sexual activity: Not on file  Lifestyle  . Physical activity    Days per week: Not on file    Minutes per session: Not on file  . Stress: Not on file  Relationships  . Social Herbalist on phone: Not on file    Gets together: Not on file    Attends religious service: Not on file    Active member of club or organization: Not on file    Attends meetings of clubs or organizations: Not on file     Relationship status: Not on file  . Intimate partner violence    Fear of current or ex partner: Not on file    Emotionally abused: Not on file    Physically abused: Not on file    Forced sexual activity: Not on file  Other Topics Concern  . Not on file  Social History Narrative   Lives with wife   Caffeine, coffee 2 cups     PHYSICAL EXAM  GENERAL EXAM/CONSTITUTIONAL: Vitals:  Vitals:   11/09/18 1520  BP: (!) 140/97  Pulse: 78  Temp: 98 F (36.7 C)  Weight: 203 lb 12.8 oz (92.4 kg)  Height: 5\' 10"  (1.778 m)     Body mass index is 29.24 kg/m. Wt Readings from Last 3 Encounters:  11/09/18 203 lb 12.8 oz (92.4 kg)  12/19/14 189 lb 9.5 oz (86 kg)  05/12/14 190 lb (86.2 kg)     Patient is in no distress; well developed, nourished and groomed; neck is supple  CARDIOVASCULAR:  Examination of carotid arteries is normal; no carotid bruits  Regular rate and rhythm, no murmurs  Examination of peripheral vascular system by observation and palpation is normal  EYES:  Ophthalmoscopic exam of optic discs and posterior segments is normal; no papilledema or hemorrhages  No exam data present  MUSCULOSKELETAL:  Gait, strength, tone, movements noted in Neurologic exam below  NEUROLOGIC: MENTAL STATUS:  No flowsheet data found.  awake, alert, oriented to person, place and time  recent and remote memory intact  normal attention and concentration  language fluent, comprehension intact, naming intact  fund of knowledge appropriate  CRANIAL NERVE:   2nd - no papilledema on fundoscopic exam  2nd, 3rd, 4th, 6th - pupils equal and reactive to light, visual fields full to confrontation, extraocular muscles intact, no nystagmus  5th - facial sensation symmetric  7th - facial strength symmetric  8th - hearing intact  9th - palate elevates symmetrically, uvula midline  11th - shoulder shrug symmetric  12th - tongue protrusion midline  MOTOR:   RESTING  TREMOR IN LUE AND LLE; BRADYKINESIA IN LUE > LLE; COGWHEELING RIGIDITY IN LUE  normal bulk; full strength in the BUE, BLE  SENSORY:   normal and symmetric to light touch, temperature, vibration  COORDINATION:   finger-nose-finger, fine finger movements normal  REFLEXES:   deep tendon reflexes present and symmetric  GAIT/STATION:   narrow based gait; MILD LEFT HAND TREMOR WITH WALKING  DIAGNOSTIC DATA (LABS, IMAGING, TESTING) - I reviewed patient records, labs, notes, testing and imaging myself where available.  Lab Results  Component Value Date   WBC 6.9 05/12/2014   HGB 14.7 05/12/2014   HCT 41.0 05/12/2014   MCV 84.7 05/12/2014   PLT 238 05/12/2014      Component Value Date/Time   NA 142 05/12/2014 1640   K 3.4 (L) 05/12/2014 1640   CL 106 05/12/2014 1640   CO2 28 05/12/2014 1640   GLUCOSE 108 (H) 05/12/2014 1640   BUN 20 05/12/2014 1640   CREATININE 0.94 05/12/2014 1640   CALCIUM 9.0 05/12/2014 1640   GFRNONAA >90 05/12/2014 1640   GFRAA >90 05/12/2014 1640   No results found for: CHOL, HDL, LDLCALC, LDLDIRECT, TRIG, CHOLHDL No results found for: TMHD6Q No results found for: VITAMINB12 No results found for: TSH     ASSESSMENT AND PLAN  58 y.o. year old male here with new onset since 2018 of resting tremor, bradykinesia, cogwheel rigidity, tremor with walking, may be related to idiopathic Parkinson's disease.  Will check MRI of the brain and lab testing to rule out other secondary causes.  We will try empiric trial of carbidopa levodopa.   Dx:  1. Tremor     PLAN:  TREMOR / BRADYKINESIA / RIGIDITY (possible parkinsonism) - check MRI brain  - check labs - carb/levo 25/100 half tab three times a day with meals; after 2 weeks increase to 1 tab three times a day with meals  Orders Placed This Encounter  Procedures  . MR BRAIN W WO CONTRAST  . TSH  . CBC with Differential/Platelet  . Comprehensive metabolic panel  . Copper, Serum  .  Ceruloplasmin   Meds ordered this encounter  Medications  . carbidopa-levodopa (SINEMET IR) 25-100 MG tablet    Sig: Take 1 tablet by mouth 3 (three) times daily before meals.    Dispense:  90 tablet    Refill:  12   Return in about 3 months (around 02/08/2019).    Suanne Marker, MD 11/09/2018, 4:19 PM Certified in Neurology, Neurophysiology and Neuroimaging  [facility] [address] [place], [place] [address] [phone]

## 2018-11-11 ENCOUNTER — Encounter: Payer: Self-pay | Admitting: *Deleted

## 2018-11-12 ENCOUNTER — Telehealth: Payer: Self-pay | Admitting: Diagnostic Neuroimaging

## 2018-11-12 LAB — CBC WITH DIFFERENTIAL/PLATELET
Basophils Absolute: 0.1 10*3/uL (ref 0.0–0.2)
Basos: 1 %
EOS (ABSOLUTE): 0.1 10*3/uL (ref 0.0–0.4)
Eos: 2 %
Hematocrit: 46.1 % (ref 37.5–51.0)
Hemoglobin: 15.7 g/dL (ref 13.0–17.7)
Immature Grans (Abs): 0 10*3/uL (ref 0.0–0.1)
Immature Granulocytes: 0 %
Lymphocytes Absolute: 1.8 10*3/uL (ref 0.7–3.1)
Lymphs: 21 %
MCH: 30 pg (ref 26.6–33.0)
MCHC: 34.1 g/dL (ref 31.5–35.7)
MCV: 88 fL (ref 79–97)
Monocytes Absolute: 0.6 10*3/uL (ref 0.1–0.9)
Monocytes: 8 %
Neutrophils Absolute: 5.7 10*3/uL (ref 1.4–7.0)
Neutrophils: 68 %
Platelets: 265 10*3/uL (ref 150–450)
RBC: 5.23 x10E6/uL (ref 4.14–5.80)
RDW: 13.3 % (ref 11.6–15.4)
WBC: 8.3 10*3/uL (ref 3.4–10.8)

## 2018-11-12 LAB — COMPREHENSIVE METABOLIC PANEL
ALT: 52 IU/L — ABNORMAL HIGH (ref 0–44)
AST: 29 IU/L (ref 0–40)
Albumin/Globulin Ratio: 2 (ref 1.2–2.2)
Albumin: 4.9 g/dL (ref 3.8–4.9)
Alkaline Phosphatase: 47 IU/L (ref 39–117)
BUN/Creatinine Ratio: 15 (ref 10–24)
BUN: 17 mg/dL (ref 8–27)
Bilirubin Total: 0.3 mg/dL (ref 0.0–1.2)
CO2: 24 mmol/L (ref 20–29)
Calcium: 9.7 mg/dL (ref 8.6–10.2)
Chloride: 106 mmol/L (ref 96–106)
Creatinine, Ser: 1.1 mg/dL (ref 0.76–1.27)
GFR calc Af Amer: 84 mL/min/{1.73_m2} (ref >59)
GFR calc non Af Amer: 73 mL/min/{1.73_m2} (ref >59)
Globulin, Total: 2.5 g/dL (ref 1.5–4.5)
Glucose: 92 mg/dL (ref 65–99)
Potassium: 4.6 mmol/L (ref 3.5–5.2)
Sodium: 144 mmol/L (ref 134–144)
Total Protein: 7.4 g/dL (ref 6.0–8.5)

## 2018-11-12 LAB — CERULOPLASMIN: Ceruloplasmin: 18.8 mg/dL (ref 16.0–31.0)

## 2018-11-12 LAB — COPPER, SERUM: Copper: 77 ug/dL (ref 72–166)

## 2018-11-12 LAB — TSH: TSH: 1.8 u[IU]/mL (ref 0.450–4.500)

## 2018-11-12 NOTE — Telephone Encounter (Signed)
[employer] pending faxed notes. I called to check the status they did receive my fax it is still pending.

## 2018-11-16 NOTE — Telephone Encounter (Signed)
no to the covid questions MR Brain w/wo contrast Dr. Leta Baptist [facility] Auth: [id number] (exp. 11/15/18 to 12/30/18). Patient is scheduled at [facility] for 11/17/18.

## 2018-11-17 ENCOUNTER — Ambulatory Visit: Payer: 59

## 2018-11-17 ENCOUNTER — Other Ambulatory Visit: Payer: Self-pay

## 2018-11-17 ENCOUNTER — Telehealth: Payer: Self-pay | Admitting: *Deleted

## 2018-11-17 DIAGNOSIS — R251 Tremor, unspecified: Secondary | ICD-10-CM

## 2018-11-17 DIAGNOSIS — G20 Parkinson's disease: Secondary | ICD-10-CM

## 2018-11-17 MED ORDER — GADOBENATE DIMEGLUMINE 529 MG/ML IV SOLN
19.0000 mL | Freq: Once | INTRAVENOUS | Status: AC | PRN
  Administered 2018-11-17: 19 mL via INTRAVENOUS

## 2018-11-18 NOTE — Telephone Encounter (Signed)
Labs ok. VRP 

## 2018-11-18 NOTE — Telephone Encounter (Signed)
Spoke with patient and informed him his labs are okay. He asked for MRI results and I advised Dr Leta Baptist hasn't sent MRI result note. He stated that if everything is ok then he has Parkinson's and wants everything to be reviewed. I advised he has a 3 month FU which cane be moved much sooner if needed. He  verbalized understanding, appreciation.

## 2018-11-19 NOTE — Telephone Encounter (Signed)
I called patient with MRI results.  MRI brain is unremarkable.  Patient has started carbidopa levodopa with some improvement in tremor.  Patient would like to set up follow-up appointment with his wife in office to discuss diagnosis and treatment options further.  Patient also requesting second opinion referral at [facility].  We will set these up.  Orders Placed This Encounter  Procedures  . Ambulatory referral to Neurology   Penni Bombard, MD 06/13/6220, 9:79 PM Certified in Neurology, Neurophysiology and Neuroimaging  [facility] [address] [place], [place] [address] [phone]

## 2018-11-19 NOTE — Addendum Note (Signed)
Addended by: Andrey Spearman R on: 11/19/2018 06:18 PM   Modules accepted: Orders

## 2018-11-23 ENCOUNTER — Telehealth: Payer: Self-pay | Admitting: Diagnostic Neuroimaging

## 2018-11-23 ENCOUNTER — Ambulatory Visit (INDEPENDENT_AMBULATORY_CARE_PROVIDER_SITE_OTHER): Payer: 59 | Admitting: Diagnostic Neuroimaging

## 2018-11-23 ENCOUNTER — Encounter: Payer: Self-pay | Admitting: Diagnostic Neuroimaging

## 2018-11-23 ENCOUNTER — Other Ambulatory Visit: Payer: Self-pay

## 2018-11-23 VITALS — BP 120/84 | HR 90 | Temp 97.5°F | Ht 70.0 in | Wt 203.0 lb

## 2018-11-23 DIAGNOSIS — R251 Tremor, unspecified: Secondary | ICD-10-CM

## 2018-11-23 DIAGNOSIS — G20 Parkinson's disease: Secondary | ICD-10-CM

## 2018-11-23 NOTE — Progress Notes (Signed)
[facility]  PATIENT: Alex Meyer DOB: Jul 06, 1960  REFERRING CLINICIAN: A Brake HISTORY FROM: patient and wife REASON FOR VISIT: follow up   HISTORICAL  CHIEF COMPLAINT:  Chief Complaint  Patient presents with  . Tremors    rm 7, FU to discuss results, next steps, wife- Mickel Baas    HISTORY OF PRESENT ILLNESS:   UPDATE (11/23/18, VRP): Since last visit, doing about the same. Here to followup diagnosis and prognosis and tx options. On carb/levo, but inconsistent. Severity is mild. No alleviating or aggravating factors. Tolerating meds. Some low back pain and left thigh tightness.   PRIOR HPI: 58 year old male here for evaluation of tremor.  2018 patient noticed resting tremor in left foot and leg. Over the past years this is progressed to his left arm and left hand.  Symptoms occur mainly at rest when he is not paying attention to it.  Symptoms worse with caffeine and stress.  Patient has family history of tremor in his father.  No change in voice, speech or swallowing.  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  Patient has some low back pain radiating to the right or left legs.  He has noticed some mild balance issues.  No change in smell or taste.  He has some mild constipation.   REVIEW OF SYSTEMS: Full 14 system review of systems performed and negative with exception of: As per HPI.  ALLERGIES: No Known Allergies  HOME MEDICATIONS: Outpatient Medications Prior to Visit  Medication Sig Dispense Refill  . acidophilus (RISAQUAD) CAPS Take 1 capsule by mouth daily.    . carbidopa-levodopa (SINEMET IR) 25-100 MG tablet Take 1 tablet by mouth 3 (three) times daily before meals. 90 tablet 12  . Melatonin 5 MG TABS Take 5 mg by mouth at bedtime.    . Multiple Vitamins-Minerals (MULTIVITAMIN WITH MINERALS) tablet Take 1 tablet by mouth daily.    . naproxen sodium (ANAPROX) 220 MG tablet Take 220 mg by mouth as  needed.     No facility-administered medications prior to visit.     PAST MEDICAL HISTORY: Past Medical History:  Diagnosis Date  . Excessive daytime sleepiness   . Insomnia   . Lumbago with sciatica, left side   . OSA on CPAP   . RLS (restless legs syndrome)    pt denies  . Tremor     PAST SURGICAL HISTORY: Past Surgical History:  Procedure Laterality Date  . APPENDECTOMY  1997    FAMILY HISTORY: Family History  Problem Relation Age of Onset  . Allergic rhinitis Father   . Tremor Father   . Heart failure Mother   . Sleep apnea Brother   . Colon cancer Maternal Grandfather   . Asthma Neg Hx   . Angioedema Neg Hx   . Eczema Neg Hx   . Immunodeficiency Neg Hx   . Urticaria Neg Hx     SOCIAL HISTORY: Social History   Socioeconomic History  . Marital status: Married    Spouse name: Mickel Baas  . Number of children: 3  . Years of education: Not on file  . Highest education level: Bachelor's degree (e.g., BA, AB, BS)  Occupational History  . Not on file  Social Needs  . Financial resource strain: Not on file  . Food insecurity    Worry: Not on file    Inability: Not on file  . Transportation needs    Medical: Not on file  Non-medical: Not on file  Tobacco Use  . Smoking status: Former Smoker    Types: Cigars    Quit date: 11/08/2016    Years since quitting: 2.0  . Smokeless tobacco: Former Neurosurgeon    Types: Snuff    Quit date: 11/08/2016  . Tobacco comment: 3/week  Substance and Sexual Activity  . Alcohol use: No    Comment: former dependence, none since 2010  . Drug use: No  . Sexual activity: Not on file  Lifestyle  . Physical activity    Days per week: Not on file    Minutes per session: Not on file  . Stress: Not on file  Relationships  . Social Musician on phone: Not on file    Gets together: Not on file    Attends religious service: Not on file    Active member of club or organization: Not on file    Attends meetings of clubs or  organizations: Not on file    Relationship status: Not on file  . Intimate partner violence    Fear of current or ex partner: Not on file    Emotionally abused: Not on file    Physically abused: Not on file    Forced sexual activity: Not on file  Other Topics Concern  . Not on file  Social History Narrative   Lives with wife   Caffeine, coffee 2 cups     PHYSICAL EXAM  GENERAL EXAM/CONSTITUTIONAL: Vitals:  Vitals:   11/23/18 1623  BP: 120/84  Pulse: 90  Temp: (!) 97.5 F (36.4 C)  Weight: 203 lb (92.1 kg)  Height: 5\' 10"  (1.778 m)   Body mass index is 29.13 kg/m. Wt Readings from Last 3 Encounters:  11/23/18 203 lb (92.1 kg)  11/09/18 203 lb 12.8 oz (92.4 kg)  12/19/14 189 lb 9.5 oz (86 kg)    Patient is in no distress; well developed, nourished and groomed; neck is supple  CARDIOVASCULAR:  Examination of carotid arteries is normal; no carotid bruits  Regular rate and rhythm, no murmurs  Examination of peripheral vascular system by observation and palpation is normal  EYES:  Ophthalmoscopic exam of optic discs and posterior segments is normal; no papilledema or hemorrhages No exam data present  MUSCULOSKELETAL:  Gait, strength, tone, movements noted in Neurologic exam below  NEUROLOGIC: MENTAL STATUS:  No flowsheet data found.  awake, alert, oriented to person, place and time  recent and remote memory intact  normal attention and concentration  language fluent, comprehension intact, naming intact  fund of knowledge appropriate  CRANIAL NERVE:   2nd - no papilledema on fundoscopic exam  2nd, 3rd, 4th, 6th - pupils equal and reactive to light, visual fields full to confrontation, extraocular muscles intact, no nystagmus  5th - facial sensation symmetric  7th - facial strength symmetric  8th - hearing intact  9th - palate elevates symmetrically, uvula midline  11th - shoulder shrug symmetric  12th - tongue protrusion midline  MOTOR:    RESTING TREMOR IN LUE AND LLE; MILD BRADYKINESIA IN LUE > LLE; NO COGWHEELING RIGIDITY  normal bulk; full strength in the BUE, BLE  SENSORY:   normal and symmetric to light touch, temperature, vibration  COORDINATION:   finger-nose-finger, fine finger movements normal  REFLEXES:   deep tendon reflexes present and symmetric  GAIT/STATION:   narrow based gait; MILD LEFT HAND TREMOR WITH WALKING     DIAGNOSTIC DATA (LABS, IMAGING, TESTING) - I reviewed patient  records, labs, notes, testing and imaging myself where available.  Lab Results  Component Value Date   WBC 8.3 11/09/2018   HGB 15.7 11/09/2018   HCT 46.1 11/09/2018   MCV 88 11/09/2018   PLT 265 11/09/2018      Component Value Date/Time   NA 144 11/09/2018 1650   K 4.6 11/09/2018 1650   CL 106 11/09/2018 1650   CO2 24 11/09/2018 1650   GLUCOSE 92 11/09/2018 1650   GLUCOSE 108 (H) 05/12/2014 1640   BUN 17 11/09/2018 1650   CREATININE 1.10 11/09/2018 1650   CALCIUM 9.7 11/09/2018 1650   PROT 7.4 11/09/2018 1650   ALBUMIN 4.9 11/09/2018 1650   AST 29 11/09/2018 1650   ALT 52 (H) 11/09/2018 1650   ALKPHOS 47 11/09/2018 1650   BILITOT 0.3 11/09/2018 1650   GFRNONAA 73 11/09/2018 1650   GFRAA 84 11/09/2018 1650   No results found for: CHOL, HDL, LDLCALC, LDLDIRECT, TRIG, CHOLHDL No results found for: [id number] No results found for: VITAMINB12 Lab Results  Component Value Date   TSH 1.800 11/09/2018   11/17/18 MRI brain  - no acute findings - stable right cerebellar linear T2 finding, without associated gliosis or enhancement, likely representing a prominent sulcus.    ASSESSMENT AND PLAN  58 y.o. year old male here with new onset since 2018 of resting tremor, bradykinesia, cogwheel rigidity, tremor with walking, may be related to idiopathic Parkinson's disease.  Will check MRI of the brain and lab testing to rule out other secondary causes.  We will try empiric trial of carbidopa levodopa.   Dx:   1. Parkinson's disease (HCC)   2. Tremor      PLAN:  TREMOR / BRADYKINESIA / RIGIDITY (possible parkinsonism) - continue carb/levo 25/100 1 tab three times a day with meals (30 min before meals); may consider rasagiline or dopamine agonists as well - optimize nutrition, exercise, sleep - second opinion with [facility] (patient request) - parkinson;s dz community resources reviewed and provided - dx, prognosis and tx options reviewed  No follow-ups on file.    Suanne MarkerVIKRAM R. , MD 11/23/2018, 5:07 PM Certified in Neurology, Neurophysiology and Neuroimaging  [facility] [address], [address] [place], [place] [address] [phone]

## 2018-11-23 NOTE — Telephone Encounter (Signed)
Patient will need his MRI CD to take to [facility].

## 2018-11-23 NOTE — Telephone Encounter (Signed)
error 

## 2018-11-23 NOTE — Telephone Encounter (Signed)
Called patient and advised him Dr Leta Baptist sent in referral to [facility] for second opinion. Advised he may not hear from [facility] for a week or two. Advised him I need to schedule follow up to discuss results and next steps. Offered FU today at 4:30, and he stated he and wife can come. I advised they arrive early to check in. Patient verbalized understanding, appreciation.

## 2019-04-12 ENCOUNTER — Other Ambulatory Visit: Payer: Self-pay | Admitting: Neurology

## 2019-04-12 MED ORDER — CARBIDOPA-LEVODOPA 25-100 MG PO TABS: 1.0000 | ORAL_TABLET | Freq: Three times a day (TID) | ORAL | 0 refills | Status: DC

## 2019-07-02 ENCOUNTER — Other Ambulatory Visit: Payer: Self-pay | Admitting: Diagnostic Neuroimaging

## 2020-02-07 NOTE — Progress Notes (Deleted)
    Patient referred by Kristen Loader, FNP for ***  Subjective:   Alex Meyer, male    DOB: 08-20-1960, 59 y.o.   MRN: [id number]  *** No chief complaint on file.   *** HPI  59 y.o. *** male with ***  *** Past Medical History:  Diagnosis Date  . Excessive daytime sleepiness   . Insomnia   . Lumbago with sciatica, left side   . OSA on CPAP   . RLS (restless legs syndrome)    pt denies  . Tremor     *** Past Surgical History:  Procedure Laterality Date  . APPENDECTOMY  1997    *** Social History   Tobacco Use  Smoking Status Former Smoker  . Types: Cigars  . Quit date: 11/08/2016  . Years since quitting: 3.2  Smokeless Tobacco Former Systems developer  . Types: Snuff  . Quit date: 11/08/2016  Tobacco Comment   3/week    Social History   Substance and Sexual Activity  Alcohol Use No   Comment: former dependence, none since 2010    *** Family History  Problem Relation Age of Onset  . Allergic rhinitis Father   . Tremor Father   . Heart failure Mother   . Sleep apnea Brother   . Colon cancer Maternal Grandfather   . Asthma Neg Hx   . Angioedema Neg Hx   . Eczema Neg Hx   . Immunodeficiency Neg Hx   . Urticaria Neg Hx     *** Current Outpatient Medications on File Prior to Visit  Medication Sig Dispense Refill  . acidophilus (RISAQUAD) CAPS Take 1 capsule by mouth daily.    . carbidopa-levodopa (SINEMET IR) 25-100 MG tablet Take 1 tablet by mouth 3 (three) times daily before meals. 270 tablet 0  . Melatonin 5 MG TABS Take 5 mg by mouth at bedtime.    . Multiple Vitamins-Minerals (MULTIVITAMIN WITH MINERALS) tablet Take 1 tablet by mouth daily.    . naproxen sodium (ANAPROX) 220 MG tablet Take 220 mg by mouth as needed.     No current facility-administered medications on file prior to visit.    Cardiovascular and other pertinent studies:  *** EKG ***/***/202***: *** EKG 02/01/2020: 1. Sinus Rhythum         2. RSR (V1)- nondiagnostic         3.  Probably normal *** Recent labs: ***/***/202***: Glucose ***, BUN/Cr ***/***. EGFR ***. Na/K ***/***. ***Rest of the CMP normal H/H ***/***. MCV ***. Platelets *** ***HbA1C ***% Chol ***, TG ***, HDL ***, LDL *** ***TSH ***normal   *** ROS      *** There were no vitals filed for this visit.   There is no height or weight on file to calculate BMI. There were no vitals filed for this visit.  *** Objective:   Physical Exam    ***     Assessment & Recommendations:   ***  ***  Thank you for referring the patient to [place]. Please feel free to contact with any questions.   Nigel Mormon, MD Pager: [phone] Office: [phone]

## 2020-02-11 ENCOUNTER — Ambulatory Visit: Payer: Self-pay | Admitting: Cardiology

## 2020-02-21 NOTE — Progress Notes (Deleted)
    Patient referred by Kristen Loader, FNP for chest pain  Subjective:   Alex Meyer, male    DOB: 03-Sep-1960, 59 y.o.   MRN: [id number]  *** No chief complaint on file.   *** HPI  59 y.o. Caucasian male with ***  *** Past Medical History:  Diagnosis Date  . Excessive daytime sleepiness   . Insomnia   . Lumbago with sciatica, left side   . OSA on CPAP   . RLS (restless legs syndrome)    pt denies  . Tremor     *** Past Surgical History:  Procedure Laterality Date  . APPENDECTOMY  1997    *** Social History   Tobacco Use  Smoking Status Former Smoker  . Types: Cigars  . Quit date: 11/08/2016  . Years since quitting: 3.2  Smokeless Tobacco Former Systems developer  . Types: Snuff  . Quit date: 11/08/2016  Tobacco Comment   3/week    Social History   Substance and Sexual Activity  Alcohol Use No   Comment: former dependence, none since 2010    *** Family History  Problem Relation Age of Onset  . Allergic rhinitis Father   . Tremor Father   . Heart failure Mother   . Sleep apnea Brother   . Colon cancer Maternal Grandfather   . Asthma Neg Hx   . Angioedema Neg Hx   . Eczema Neg Hx   . Immunodeficiency Neg Hx   . Urticaria Neg Hx     *** Current Outpatient Medications on File Prior to Visit  Medication Sig Dispense Refill  . acidophilus (RISAQUAD) CAPS Take 1 capsule by mouth daily.    . carbidopa-levodopa (SINEMET IR) 25-100 MG tablet Take 1 tablet by mouth 3 (three) times daily before meals. 270 tablet 0  . Melatonin 5 MG TABS Take 5 mg by mouth at bedtime.    . Multiple Vitamins-Minerals (MULTIVITAMIN WITH MINERALS) tablet Take 1 tablet by mouth daily.    . naproxen sodium (ANAPROX) 220 MG tablet Take 220 mg by mouth as needed.     No current facility-administered medications on file prior to visit.    Cardiovascular and other pertinent studies:  *** EKG ***/***/202***: ***  *** Recent labs: ***/***/202***: Glucose ***, BUN/Cr ***/***.  EGFR ***. Na/K ***/***. ***Rest of the CMP normal H/H ***/***. MCV ***. Platelets *** ***HbA1C ***% Chol ***, TG ***, HDL ***, LDL *** ***TSH ***normal   *** ROS      *** There were no vitals filed for this visit.   There is no height or weight on file to calculate BMI. There were no vitals filed for this visit.  *** Objective:   Physical Exam    ***     Assessment & Recommendations:   ***  ***  Thank you for referring the patient to [place]. Please feel free to contact with any questions.   Nigel Mormon, MD Pager: [phone] Office: [phone]

## 2020-02-23 ENCOUNTER — Ambulatory Visit: Payer: Self-pay | Admitting: Cardiology

## 2020-03-09 DIAGNOSIS — R072 Precordial pain: Secondary | ICD-10-CM | POA: Insufficient documentation

## 2020-03-09 NOTE — Progress Notes (Addendum)
Patient referred by Soundra Pilon, FNP for chest pain  Subjective:   Alex Meyer, male    DOB: 11/24/1960, 60 y.o.   MRN: [id number]   Chief Complaint  Patient presents with  . Chest Pain  . New Patient (Initial Visit)     HPI  60 y.o. Caucasian male with tremors, chest pain.  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 He is relatively active, walks on treadmill up to 15 min at moderate speed.  He has intermittent episodes of left-sided squeezing chest pain, 7 years.  Episodes last for up to 2 minutes.  Not related to exertion.   Last episode occurred yesterday, after a large meal, he was sitting in a bathtub.  There is no specific aggravating or relieving factors.  Episodes are Regular tenderness.  There is no associated shortness of breath.  Patient smokes cigars and chewing tobacco times a week in the past, but 4 years ago.  He quit alcohol 11 years ago.  His mother had congestive heart failure at age 50.  There is no other cardiac history in the family.   Past Medical History:  Diagnosis Date  . Excessive daytime sleepiness   . Insomnia   . Lumbago with sciatica, left side   . OSA on CPAP   . RLS (restless legs syndrome)    pt denies  . Tremor      Past Surgical History:  Procedure Laterality Date  . APPENDECTOMY  1997     Social History   Tobacco Use  Smoking Status Former Smoker  . Types: Cigars  . Quit date: 11/08/2016  . Years since quitting: 3.3  Smokeless Tobacco Former Neurosurgeon  . Types: Snuff  . Quit date: 11/08/2016  Tobacco Comment   3/week    Social History   Substance and Sexual Activity  Alcohol Use No   Comment: former dependence, none since 2010     Family History  Problem Relation Age of Onset  . Allergic rhinitis Father   . Tremor Father   . Heart failure Mother   . Sleep apnea Brother   . Colon cancer Maternal Grandfather   . Asthma Neg Hx   . Angioedema Neg Hx   . Eczema Neg Hx   . Immunodeficiency Neg Hx   .  Urticaria Neg Hx      Current Outpatient Medications on File Prior to Visit  Medication Sig Dispense Refill  . carbidopa-levodopa (SINEMET IR) 25-100 MG tablet Take 1 tablet by mouth 3 (three) times daily before meals. 270 tablet 0  . fexofenadine (ALLEGRA) 180 MG tablet Take 1 tablet by mouth daily.    . Melatonin 5 MG TABS Take 5 mg by mouth at bedtime.    . Multiple Vitamins-Minerals (MULTIVITAMIN WITH MINERALS) tablet Take 1 tablet by mouth daily.    . naproxen sodium (ANAPROX) 220 MG tablet Take 220 mg by mouth as needed.     No current facility-administered medications on file prior to visit.    Cardiovascular and other pertinent studies:   EKG 02/01/2020: Sinus rhythm Normal EKG   Recent labs: N/A   Review of Systems  Cardiovascular: Positive for chest pain. Negative for dyspnea on exertion, leg swelling, palpitations and syncope.         Vitals:   03/10/20 0852  Temp: (!) 97.1 F (36.2 C)     Body mass index is 28.55 kg/m. Filed Weights   03/10/20 0852  Weight: 199 lb (90.3 kg)  Objective:   Physical Exam Vitals and nursing note reviewed.  Constitutional:      General: He is not in acute distress. Neck:     Vascular: No JVD.  Cardiovascular:     Rate and Rhythm: Normal rate and regular rhythm.     Heart sounds: Normal heart sounds. No murmur heard.   Pulmonary:     Effort: Pulmonary effort is normal.     Breath sounds: Normal breath sounds. No wheezing or rales.          Assessment & Recommendations:   60 y.o. Caucasian male with tremors, chest pain.  Chest pain: Less likely to be cardiac etiology.  I will perform exercise treadmill stress test and calcium score scan.  Patient also further evaluated prevention strategies.  We will also check lipid panel.  Discussed heart healthy diet and lifestyle  I will see him on as needed basis, unless significant abnormalities found on above work-up.   Thank you for referring the  patient to [place]. Please feel free to contact with any questions.   Elder Negus, MD Pager: [phone] Office: [phone]

## 2020-03-10 ENCOUNTER — Encounter: Payer: Self-pay | Admitting: Cardiology

## 2020-03-10 ENCOUNTER — Ambulatory Visit: Payer: Managed Care, Other (non HMO) | Admitting: Cardiology

## 2020-03-10 ENCOUNTER — Other Ambulatory Visit: Payer: Self-pay

## 2020-03-10 VITALS — BP 135/88 | HR 50 | Temp 97.1°F | Resp 16 | Ht 70.0 in | Wt 199.0 lb

## 2020-03-10 DIAGNOSIS — R072 Precordial pain: Secondary | ICD-10-CM

## 2020-03-10 NOTE — Patient Instructions (Signed)
Diet & Lifestyle recommendations:  Physical activity recommendation (The Physical Activity Guidelines for Americans. JAMA 2018;Nov 12) At least 150-300 minutes a week of moderate-intensity, or 75-150 minutes a week of vigorous-intensity aerobic physical activity, or an equivalent combination of moderate- and vigorous-intensity aerobic activity. Adults should perform muscle-strengthening activities on 2 or more days a week. Older adults should do multicomponent physical activity that includes balance training as well as aerobic and muscle-strengthening activities. Benefits of increased physical activity include lower risk of mortality including cardiovascular mortality, lower risk of cardiovascular events and associated risk factors (hypertension and diabetes), and lower risk of many cancers (including bladder, breast, colon, endometrium, esophagus, kidney, lung, and stomach). Additional improvments have been seen in cognition, risk of dementia, anxiety and depression, improved bone health, lower risk of falls, and associated injuries.  Dietary recommendation The 2019 ACC/AHA guidelines promote nutrition as a main fixture of cardiovascular wellness, with a recommendation for a varied diet of fruit, vegetables, fish, legumes, and whole grains (Class I), as well as recommendations to reduce sodium, cholesterol, processed meats, and refined sugars (Class IIa recommendation).10 Sodium intake, a topic of some controversy as of late, is recommended to be kept at 1,500 mg/day or less, far below the average daily intake in the [place] of 3,409 mg/day, and notably below that of previous US recommendations for <2,300mg/day.10,11 For those unable to reach 1,500 mg/day, they recommend at least a reduction of 1000 mg/day.  A Pesco-Mediterranean Diet With Intermittent Fasting: JACC Review Topic of the Week. J Am Coll Cardiol 2020;76:1484-1493 Pesco-Mediterranean diet, it is supplemented with extra-virgin olive oil (EVOO),  which is the principle fat source, along with moderate amounts of dairy (particularly yogurt and cheese) and eggs, as well as modest amounts of alcohol consumption (ideally red wine with the evening meal), but few red and processed meats.  

## 2020-03-16 NOTE — Addendum Note (Signed)
Addended by: Elder Negus on: 03/16/2020 05:50 PM   Modules accepted: Orders

## 2020-03-24 ENCOUNTER — Other Ambulatory Visit (HOSPITAL_COMMUNITY)
Admission: RE | Admit: 2020-03-24 | Discharge: 2020-03-24 | Disposition: A | Discharge status: Home / Self Care | Payer: 59 | Source: Ambulatory Visit | Attending: Cardiology | Admitting: Cardiology

## 2020-03-24 DIAGNOSIS — Z20822 Contact with and (suspected) exposure to covid-19: Secondary | ICD-10-CM | POA: Insufficient documentation

## 2020-03-24 DIAGNOSIS — Z01812 Encounter for preprocedural laboratory examination: Secondary | ICD-10-CM | POA: Insufficient documentation

## 2020-03-24 LAB — SARS CORONAVIRUS 2 (TAT 6-24 HRS): SARS Coronavirus 2: NEGATIVE

## 2020-03-25 LAB — LIPID PANEL
Chol/HDL Ratio: 4.1 ratio (ref 0.0–5.0)
Cholesterol, Total: 161 mg/dL (ref 100–199)
HDL: 39 mg/dL — ABNORMAL LOW (ref >39)
LDL Chol Calc (NIH): 103 mg/dL — ABNORMAL HIGH (ref 0–99)
Triglycerides: 100 mg/dL (ref 0–149)
VLDL Cholesterol Cal: 19 mg/dL (ref 5–40)

## 2020-03-27 ENCOUNTER — Other Ambulatory Visit: Payer: Self-pay

## 2020-03-27 ENCOUNTER — Ambulatory Visit: Payer: Managed Care, Other (non HMO)

## 2020-03-28 ENCOUNTER — Inpatient Hospital Stay (HOSPITAL_COMMUNITY): Admission: RE | Admit: 2020-03-28 | Payer: Self-pay | Source: Ambulatory Visit

## 2020-03-28 NOTE — Telephone Encounter (Signed)
Anabell, Admin, can you please look into the details of this?   Thanks MJP

## 2021-03-13 ENCOUNTER — Ambulatory Visit (INDEPENDENT_AMBULATORY_CARE_PROVIDER_SITE_OTHER): Payer: 59 | Admitting: Diagnostic Neuroimaging

## 2021-03-13 ENCOUNTER — Encounter: Payer: Self-pay | Admitting: Diagnostic Neuroimaging

## 2021-03-13 ENCOUNTER — Other Ambulatory Visit: Payer: Self-pay

## 2021-03-13 VITALS — BP 117/80 | HR 65 | Ht 69.0 in | Wt 190.0 lb

## 2021-03-13 DIAGNOSIS — G20 Parkinson's disease: Secondary | ICD-10-CM

## 2021-03-13 MED ORDER — CARBIDOPA-LEVODOPA 25-100 MG PO TABS: 1.0000 | ORAL_TABLET | Freq: Three times a day (TID) | ORAL | 4 refills | Status: DC

## 2021-03-13 NOTE — Patient Instructions (Signed)
TREMOR / BRADYKINESIA / RIGIDITY (idiopathic parkinson's disease) - continue carb/levo 25/100 1 tab three times a day with meals (30 min before meals); may increase to 1.5 or 2 tabs three times a day - may consider rasagiline in future - continue to optimize nutrition, exercise, sleep

## 2021-03-13 NOTE — Progress Notes (Signed)
[facility]  PATIENT: Alex Meyer DOB: [birth year]  REFERRING CLINICIAN: Soundra Pilon, FNP  HISTORY FROM: patient REASON FOR VISIT: follow up   HISTORICAL  CHIEF COMPLAINT:  Chief Complaint  Patient presents with   Parkinson's disease    Rm 7, est pt last seen 10/2018, "was going to [facility] but want to return here; tremors more in afternoons, urinary incontinence; less focused at work, change in motivation"    HISTORY OF PRESENT ILLNESS:   UPDATE (03/13/20, VRP): Since last visit, some progression of tremor. On carb/levo 1 tab three times (7am, 11am, 3pm) a day; some wearing off around 4pm.   UPDATE (11/23/18, VRP): Since last visit, doing about the same. Here to followup diagnosis and prognosis and tx options. On carb/levo, but inconsistent. Severity is mild. No alleviating or aggravating factors. Tolerating meds. Some low back pain and left thigh tightness.   PRIOR HPI: 61 year old male here for evaluation of tremor.  2018 patient noticed resting tremor in left foot and leg. Over the past years this is progressed to his left arm and left hand.  Symptoms occur mainly at rest when he is not paying attention to it.  Symptoms worse with caffeine and stress.  Patient has family history of tremor in his father.  No change in voice, speech or swallowing.  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  Patient has some low back pain radiating to the right or left legs.  He has noticed some mild balance issues.  No change in smell or taste.  He has some mild constipation.   REVIEW OF SYSTEMS: Full 14 system review of systems performed and negative with exception of: As per HPI.  ALLERGIES: No Known Allergies  HOME MEDICATIONS: Outpatient Medications Prior to Visit  Medication Sig Dispense Refill   fexofenadine (ALLEGRA) 180 MG tablet Take 1 tablet by mouth daily.     Melatonin 5 MG TABS Take 5 mg by mouth at  bedtime.     Multiple Vitamins-Minerals (MULTIVITAMIN WITH MINERALS) tablet Take 1 tablet by mouth daily.     naproxen sodium (ANAPROX) 220 MG tablet Take 220 mg by mouth as needed.     carbidopa-levodopa (SINEMET IR) 25-100 MG tablet Take 1 tablet by mouth 3 (three) times daily before meals. 270 tablet 0   No facility-administered medications prior to visit.    PAST MEDICAL HISTORY: Past Medical History:  Diagnosis Date   Excessive daytime sleepiness    Insomnia    Lumbago with sciatica, left side    OSA on CPAP    Parkinson's disease (HCC)    RLS (restless legs syndrome)    pt denies   Tremor     PAST SURGICAL HISTORY: Past Surgical History:  Procedure Laterality Date   APPENDECTOMY  1997    FAMILY HISTORY: Family History  Problem Relation Age of Onset   Allergic rhinitis Father    Tremor Father    Heart failure Mother    Sleep apnea Brother    Colon cancer Maternal Grandfather    Asthma Neg Hx    Angioedema Neg Hx    Eczema Neg Hx    Immunodeficiency Neg Hx    Urticaria Neg Hx     SOCIAL HISTORY: Social History   Socioeconomic History   Marital status: Married    Spouse name: Vernona Rieger   Number of children: 3   Years of education: Not on file   Highest  education level: Bachelor's degree (e.g., BA, AB, BS)  Occupational History   Not on file  Tobacco Use   Smoking status: Former    Types: Cigars    Quit date: 11/08/2016    Years since quitting: 4.3   Smokeless tobacco: Former    Types: Snuff    Quit date: 11/08/2016   Tobacco comments:    3/week  Substance and Sexual Activity   Alcohol use: No    Comment: former dependence, none since 2010   Drug use: No   Sexual activity: Not on file  Other Topics Concern   Not on file  Social History Narrative   Lives with wife   Caffeine, coffee 2 cups   Social Determinants of Health   Financial Resource Strain: Not on file  Food Insecurity: Not on file  Transportation Needs: Not on file  Physical  Activity: Not on file  Stress: Not on file  Social Connections: Not on file  Intimate Partner Violence: Not on file     PHYSICAL EXAM  GENERAL EXAM/CONSTITUTIONAL: Vitals:  Vitals:   03/13/21 0830  BP: 117/80  Pulse: 65  Weight: 190 lb (86.2 kg)  Height: 5\' 9"  (1.753 m)   Body mass index is 28.06 kg/m. Wt Readings from Last 3 Encounters:  03/13/21 190 lb (86.2 kg)  03/10/20 199 lb (90.3 kg)  11/23/18 203 lb (92.1 kg)   Patient is in no distress; well developed, nourished and groomed; neck is supple  CARDIOVASCULAR: Examination of carotid arteries is normal; no carotid bruits Regular rate and rhythm, no murmurs Examination of peripheral vascular system by observation and palpation is normal  EYES: Ophthalmoscopic exam of optic discs and posterior segments is normal; no papilledema or hemorrhages No results found.  MUSCULOSKELETAL: Gait, strength, tone, movements noted in Neurologic exam below  NEUROLOGIC: MENTAL STATUS:  No flowsheet data found. awake, alert, oriented to person, place and time recent and remote memory intact normal attention and concentration language fluent, comprehension intact, naming intact fund of knowledge appropriate  CRANIAL NERVE:  2nd - no papilledema on fundoscopic exam 2nd, 3rd, 4th, 6th - pupils equal and reactive to light, visual fields full to confrontation, extraocular muscles intact, no nystagmus 5th - facial sensation symmetric 7th - facial strength symmetric 8th - hearing intact 9th - palate elevates symmetrically, uvula midline 11th - shoulder shrug symmetric 12th - tongue protrusion midline  MOTOR:  RESTING TREMOR IN LUE; MILD BRADYKINESIA IN LUE > LLE; NO COGWHEELING RIGIDITY normal bulk; full strength in the BUE, BLE  SENSORY:  normal and symmetric to light touch, temperature, vibration  COORDINATION:  finger-nose-finger, fine finger movements normal  REFLEXES:  deep tendon reflexes present and  symmetric  GAIT/STATION:  narrow based gait; MILD LEFT HAND TREMOR WITH WALKING     DIAGNOSTIC DATA (LABS, IMAGING, TESTING) - I reviewed patient records, labs, notes, testing and imaging myself where available.  Lab Results  Component Value Date   WBC 8.3 11/09/2018   HGB 15.7 11/09/2018   HCT 46.1 11/09/2018   MCV 88 11/09/2018   PLT 265 11/09/2018      Component Value Date/Time   NA 144 11/09/2018 1650   K 4.6 11/09/2018 1650   CL 106 11/09/2018 1650   CO2 24 11/09/2018 1650   GLUCOSE 92 11/09/2018 1650   GLUCOSE 108 (H) 05/12/2014 1640   BUN 17 11/09/2018 1650   CREATININE 1.10 11/09/2018 1650   CALCIUM 9.7 11/09/2018 1650   PROT 7.4 11/09/2018 1650  ALBUMIN 4.9 11/09/2018 1650   AST 29 11/09/2018 1650   ALT 52 (H) 11/09/2018 1650   ALKPHOS 47 11/09/2018 1650   BILITOT 0.3 11/09/2018 1650   GFRNONAA 73 11/09/2018 1650   GFRAA 84 11/09/2018 1650   Lab Results  Component Value Date   CHOL 161 03/24/2020   HDL 39 (L) 03/24/2020   LDLCALC 103 (H) 03/24/2020   TRIG 100 03/24/2020   CHOLHDL 4.1 03/24/2020   No results found for: HGBA1C No results found for: VITAMINB12 Lab Results  Component Value Date   TSH 1.800 11/09/2018    11/17/18 MRI brain  - no acute findings - stable right cerebellar linear T2 finding, without associated gliosis or enhancement, likely representing a prominent sulcus.    ASSESSMENT AND PLAN  61 y.o. year old male here with new onset since 2018 of resting tremor, bradykinesia, cogwheel rigidity, tremor with walking, may be related to idiopathic Parkinson's disease.  Dx:  1. Parkinson's disease (HCC)     PLAN:  TREMOR / BRADYKINESIA / RIGIDITY (idiopathic parkinson's disease) - continue carb/levo 25/100 1 tab three times a day with meals (30 min before meals); may increase to 1.5 or 2 tabs three times a day - may consider rasagiline in future - continue to optimize nutrition, exercise, sleep  Meds ordered this encounter   Medications   carbidopa-levodopa (SINEMET IR) 25-100 MG tablet    Sig: Take 1-2 tablets by mouth 3 (three) times daily before meals.    Dispense:  540 tablet    Refill:  4   Return in about 1 year (around 03/13/2022).    Suanne MarkerVIKRAM R. Jahmel Flannagan, MD 03/13/2021, 9:30 AM Certified in Neurology, Neurophysiology and Neuroimaging  [facility] [address], [address] [place], [place] [address] [phone]

## 2022-01-28 ENCOUNTER — Encounter: Payer: Self-pay | Admitting: Diagnostic Neuroimaging

## 2022-01-28 ENCOUNTER — Ambulatory Visit (INDEPENDENT_AMBULATORY_CARE_PROVIDER_SITE_OTHER): Payer: 59 | Admitting: Diagnostic Neuroimaging

## 2022-01-28 VITALS — BP 126/88 | HR 67 | Ht 69.0 in | Wt 187.0 lb

## 2022-01-28 DIAGNOSIS — G20B2 Parkinson's disease with dyskinesia, with fluctuations: Secondary | ICD-10-CM

## 2022-01-28 MED ORDER — RASAGILINE MESYLATE 0.5 MG PO TABS: 0.5000 mg | ORAL_TABLET | Freq: Every day | ORAL | 6 refills | Status: DC

## 2022-01-28 NOTE — Progress Notes (Signed)
[facility]  PATIENT: Alex Meyer DOB: 1960/10/25  REFERRING CLINICIAN: Soundra Pilon, FNP  HISTORY FROM: patient REASON FOR VISIT: follow up   HISTORICAL  CHIEF COMPLAINT:  Chief Complaint  Patient presents with   Follow-up    RM 6 alone Pt is well, reports L hand tremors have increased. Balance is stable.  Tremors more when aroused, more fatigued in afternoon     HISTORY OF PRESENT ILLNESS:   UPDATE (03/13/20, VRP): Since last visit, some progression of tremor. On carb/levo 1 tab three times (7am, 11am, 3pm) a day; some wearing off around 4pm.   UPDATE (11/23/18, VRP): Since last visit, doing about the same. Here to followup diagnosis and prognosis and tx options. On carb/levo, but inconsistent. Severity is mild. No alleviating or aggravating factors. Tolerating meds. Some low back pain and left thigh tightness.   PRIOR HPI: 61 year old male here for evaluation of tremor.  2018 patient noticed resting tremor in left foot and leg. Over the past years this is progressed to his left arm and left hand.  Symptoms occur mainly at rest when he is not paying attention to it.  Symptoms worse with caffeine and stress.  Patient has family history of tremor in his father.  No change in voice, speech or swallowing.  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  Patient has some low back pain radiating to the right or left legs.  He has noticed some mild balance issues.  No change in smell or taste.  He has some mild constipation.   REVIEW OF SYSTEMS: Full 14 system review of systems performed and negative with exception of: As per HPI.  ALLERGIES: No Known Allergies  HOME MEDICATIONS: Outpatient Medications Prior to Visit  Medication Sig Dispense Refill   carbidopa-levodopa (SINEMET IR) 25-100 MG tablet Take 1-2 tablets by mouth 3 (three) times daily before meals. 540 tablet 4   fexofenadine (ALLEGRA) 180 MG  tablet Take 1 tablet by mouth daily.     Melatonin 5 MG TABS Take 5 mg by mouth at bedtime.     Multiple Vitamins-Minerals (MULTIVITAMIN WITH MINERALS) tablet Take 1 tablet by mouth daily.     naproxen sodium (ANAPROX) 220 MG tablet Take 220 mg by mouth as needed.     No facility-administered medications prior to visit.    PAST MEDICAL HISTORY: Past Medical History:  Diagnosis Date   Excessive daytime sleepiness    Insomnia    Lumbago with sciatica, left side    OSA on CPAP    Parkinson's disease    RLS (restless legs syndrome)    pt denies   Tremor     PAST SURGICAL HISTORY: Past Surgical History:  Procedure Laterality Date   APPENDECTOMY  1997    FAMILY HISTORY: Family History  Problem Relation Age of Onset   Allergic rhinitis Father    Tremor Father    Heart failure Mother    Sleep apnea Brother    Colon cancer Maternal Grandfather    Asthma Neg Hx    Angioedema Neg Hx    Eczema Neg Hx    Immunodeficiency Neg Hx    Urticaria Neg Hx     SOCIAL HISTORY: Social History   Socioeconomic History   Marital status: Married    Spouse name: Vernona Rieger   Number of children: 3   Years of education: Not on file   Highest education level: Bachelor's degree (e.g., BA,  AB, BS)  Occupational History   Not on file  Tobacco Use   Smoking status: Former    Types: Cigars    Quit date: 11/08/2016    Years since quitting: 5.2   Smokeless tobacco: Former    Types: Snuff    Quit date: 11/08/2016   Tobacco comments:    3/week  Substance and Sexual Activity   Alcohol use: No    Comment: former dependence, none since 2010   Drug use: No   Sexual activity: Not on file  Other Topics Concern   Not on file  Social History Narrative   Lives with wife   Caffeine, coffee 2 cups   Social Determinants of Health   Financial Resource Strain: Not on file  Food Insecurity: Not on file  Transportation Needs: Not on file  Physical Activity: Not on file  Stress: Not on file  Social  Connections: Not on file  Intimate Partner Violence: Not on file     PHYSICAL EXAM  GENERAL EXAM/CONSTITUTIONAL: Vitals:  Vitals:   01/28/22 1526  BP: 126/88  Pulse: 67  Weight: 187 lb (84.8 kg)  Height: 5\' 9"  (1.753 m)   Body mass index is 27.62 kg/m. Wt Readings from Last 3 Encounters:  01/28/22 187 lb (84.8 kg)  03/13/21 190 lb (86.2 kg)  03/10/20 199 lb (90.3 kg)   Patient is in no distress; well developed, nourished and groomed; neck is supple  CARDIOVASCULAR: Examination of carotid arteries is normal; no carotid bruits Regular rate and rhythm, no murmurs Examination of peripheral vascular system by observation and palpation is normal  EYES: Ophthalmoscopic exam of optic discs and posterior segments is normal; no papilledema or hemorrhages No results found.  MUSCULOSKELETAL: Gait, strength, tone, movements noted in Neurologic exam below  NEUROLOGIC: MENTAL STATUS:      No data to display         awake, alert, oriented to person, place and time recent and remote memory intact normal attention and concentration language fluent, comprehension intact, naming intact fund of knowledge appropriate  CRANIAL NERVE:  2nd - no papilledema on fundoscopic exam 2nd, 3rd, 4th, 6th - pupils equal and reactive to light, visual fields full to confrontation, extraocular muscles intact, no nystagmus 5th - facial sensation symmetric 7th - facial strength symmetric 8th - hearing intact 9th - palate elevates symmetrically, uvula midline 11th - shoulder shrug symmetric 12th - tongue protrusion midline  MOTOR:  RARE TREMOR IN LUE; MILD BRADYKINESIA IN LUE > LLE; NO COGWHEELING RIGIDITY normal bulk; full strength in the BUE, BLE  SENSORY:  normal and symmetric to light touch, temperature, vibration  COORDINATION:  finger-nose-finger, fine finger movements normal  REFLEXES:  deep tendon reflexes TRACE and symmetric  GAIT/STATION:  narrow based gait; MILD LEFT HAND  TREMOR WITH WALKING; smooth stride     DIAGNOSTIC DATA (LABS, IMAGING, TESTING) - I reviewed patient records, labs, notes, testing and imaging myself where available.  Lab Results  Component Value Date   WBC 8.3 11/09/2018   HGB 15.7 11/09/2018   HCT 46.1 11/09/2018   MCV 88 11/09/2018   PLT 265 11/09/2018      Component Value Date/Time   NA 144 11/09/2018 1650   K 4.6 11/09/2018 1650   CL 106 11/09/2018 1650   CO2 24 11/09/2018 1650   GLUCOSE 92 11/09/2018 1650   GLUCOSE 108 (H) 05/12/2014 1640   BUN 17 11/09/2018 1650   CREATININE 1.10 11/09/2018 1650   CALCIUM 9.7 11/09/2018 1650  PROT 7.4 11/09/2018 1650   ALBUMIN 4.9 11/09/2018 1650   AST 29 11/09/2018 1650   ALT 52 (H) 11/09/2018 1650   ALKPHOS 47 11/09/2018 1650   BILITOT 0.3 11/09/2018 1650   GFRNONAA 73 11/09/2018 1650   GFRAA 84 11/09/2018 1650   Lab Results  Component Value Date   CHOL 161 03/24/2020   HDL 39 (L) 03/24/2020   LDLCALC 103 (H) 03/24/2020   TRIG 100 03/24/2020   CHOLHDL 4.1 03/24/2020   No results found for: "HGBA1C" No results found for: "VITAMINB12" Lab Results  Component Value Date   TSH 1.800 11/09/2018    11/17/18 MRI brain  - no acute findings - stable right cerebellar linear T2 finding, without associated gliosis or enhancement, likely representing a prominent sulcus.    ASSESSMENT AND PLAN  61 y.o. year old male here with new onset since 2018 of resting tremor, bradykinesia, cogwheel rigidity, tremor with walking, may be related to idiopathic Parkinson's disease.  Dx:  1. Parkinson's disease with dyskinesia and fluctuating manifestations     PLAN:  TREMOR / BRADYKINESIA / RIGIDITY (idiopathic parkinson's disease) - continue carb/levo 25/100 1-2 tab three times a day with meals (30 min before meals) - ADD rasagiline 0.5mg  daily - continue to optimize nutrition, exercise, sleep  Meds ordered this encounter  Medications   rasagiline (AZILECT) 0.5 MG TABS tablet     Sig: Take 1 tablet (0.5 mg total) by mouth daily.    Dispense:  30 tablet    Refill:  6   Return in about 8 months (around 09/29/2022).    Suanne Marker, MD 01/28/2022, 4:14 PM Certified in Neurology, Neurophysiology and Neuroimaging  [facility] [place] [facility] [address], [address] [place], [place] [address] [phone]

## 2022-03-04 ENCOUNTER — Encounter: Payer: Self-pay | Admitting: Diagnostic Neuroimaging

## 2022-03-04 ENCOUNTER — Other Ambulatory Visit: Payer: Self-pay

## 2022-03-04 MED ORDER — RASAGILINE MESYLATE 0.5 MG PO TABS: 0.5000 mg | ORAL_TABLET | Freq: Every day | ORAL | 6 refills | Status: DC

## 2022-04-15 ENCOUNTER — Encounter: Payer: Self-pay | Admitting: Diagnostic Neuroimaging

## 2022-05-02 ENCOUNTER — Ambulatory Visit
Admission: RE | Admit: 2022-05-02 | Discharge: 2022-05-02 | Disposition: A | Discharge status: Home / Self Care | Payer: 59 | Source: Ambulatory Visit | Attending: Family Medicine | Admitting: Family Medicine

## 2022-05-02 ENCOUNTER — Other Ambulatory Visit: Payer: Self-pay | Admitting: Family Medicine

## 2022-05-02 DIAGNOSIS — M5412 Radiculopathy, cervical region: Secondary | ICD-10-CM

## 2022-06-13 ENCOUNTER — Other Ambulatory Visit: Payer: Self-pay | Admitting: Sports Medicine

## 2022-06-13 DIAGNOSIS — M25562 Pain in left knee: Secondary | ICD-10-CM

## 2022-07-03 ENCOUNTER — Encounter: Payer: Self-pay | Admitting: Sports Medicine

## 2022-07-04 ENCOUNTER — Ambulatory Visit
Admission: RE | Admit: 2022-07-04 | Discharge: 2022-07-04 | Disposition: A | Discharge status: Home / Self Care | Payer: 59 | Source: Ambulatory Visit | Attending: Sports Medicine | Admitting: Sports Medicine

## 2022-07-04 DIAGNOSIS — M25562 Pain in left knee: Secondary | ICD-10-CM

## 2022-09-30 ENCOUNTER — Ambulatory Visit: Payer: 59 | Admitting: Diagnostic Neuroimaging

## 2022-10-08 ENCOUNTER — Ambulatory Visit (INDEPENDENT_AMBULATORY_CARE_PROVIDER_SITE_OTHER): Payer: 59 | Admitting: Diagnostic Neuroimaging

## 2022-10-08 ENCOUNTER — Encounter: Payer: Self-pay | Admitting: Diagnostic Neuroimaging

## 2022-10-08 VITALS — BP 111/76 | HR 63 | Ht 68.0 in | Wt 183.0 lb

## 2022-10-08 DIAGNOSIS — G20B2 Parkinson's disease with dyskinesia, with fluctuations: Secondary | ICD-10-CM

## 2022-10-08 MED ORDER — CARBIDOPA-LEVODOPA 25-100 MG PO TABS: 1.0000 | ORAL_TABLET | Freq: Three times a day (TID) | ORAL | 4 refills | Status: DC

## 2022-10-08 NOTE — Progress Notes (Signed)
[facility]  PATIENT: Alex Meyer DOB: [birth year]  REFERRING CLINICIAN: Soundra Pilon, FNP  HISTORY FROM: patient REASON FOR VISIT: follow up   HISTORICAL  CHIEF COMPLAINT:  Chief Complaint  Patient presents with   Follow-up    Rm 7, here alone Pt is here following up on Parkinson's Disease. Pt states his tremor has stayed the same. States he gets restless and fatigues in the evenings.     HISTORY OF PRESENT ILLNESS:   UPDATE (10/08/22, VRP): Since last visit, doing about the same with PD. Tried rasagiline but not much benefit. Around feb 2024 had a fall, right rib injury, left knee pain, neck pain. Managed conservatively by ortho clinic. Some intermittent olfactory hallucinations.   UPDATE (01/28/22, VRP): Since last visit, left hand tremor increased. Balance stable.   UPDATE (03/13/20, VRP): Since last visit, some progression of tremor. On carb/levo 1 tab three times (7am, 11am, 3pm) a day; some wearing off around 4pm.   UPDATE (11/23/18, VRP): Since last visit, doing about the same. Here to followup diagnosis and prognosis and tx options. On carb/levo, but inconsistent. Severity is mild. No alleviating or aggravating factors. Tolerating meds. Some low back pain and left thigh tightness.   PRIOR HPI: 62 year old male here for evaluation of tremor.  2018 patient noticed resting tremor in left foot and leg. Over the past years this is progressed to his left arm and left hand.  Symptoms occur mainly at rest when he is not paying attention to it.  Symptoms worse with caffeine and stress.  Patient has family history of tremor in his father.  No change in voice, speech or swallowing.  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  Patient has some low back pain radiating to the right or left legs.  He has noticed some mild balance issues.  No change in smell or taste.  He has some mild constipation.   REVIEW OF  SYSTEMS: Full 14 system review of systems performed and negative with exception of: As per HPI.  ALLERGIES: No Known Allergies  HOME MEDICATIONS: Outpatient Medications Prior to Visit  Medication Sig Dispense Refill   fexofenadine (ALLEGRA) 180 MG tablet Take 1 tablet by mouth daily.     Melatonin 5 MG TABS Take 5 mg by mouth at bedtime.     Multiple Vitamins-Minerals (MULTIVITAMIN WITH MINERALS) tablet Take 1 tablet by mouth daily.     carbidopa-levodopa (SINEMET IR) 25-100 MG tablet Take 1-2 tablets by mouth 3 (three) times daily before meals. 540 tablet 4   naproxen sodium (ANAPROX) 220 MG tablet Take 220 mg by mouth as needed.     rasagiline (AZILECT) 0.5 MG TABS tablet Take 1 tablet (0.5 mg total) by mouth daily. 30 tablet 6   No facility-administered medications prior to visit.    PAST MEDICAL HISTORY: Past Medical History:  Diagnosis Date   Excessive daytime sleepiness    Insomnia    Lumbago with sciatica, left side    OSA on CPAP    Parkinson's disease    RLS (restless legs syndrome)    pt denies   Tremor     PAST SURGICAL HISTORY: Past Surgical History:  Procedure Laterality Date   APPENDECTOMY  1997    FAMILY HISTORY: Family History  Problem Relation Age of Onset   Allergic rhinitis Father    Tremor Father    Heart failure Mother    Sleep apnea Brother  Colon cancer Maternal Grandfather    Asthma Neg Hx    Angioedema Neg Hx    Eczema Neg Hx    Immunodeficiency Neg Hx    Urticaria Neg Hx     SOCIAL HISTORY: Social History   Socioeconomic History   Marital status: Married    Spouse name: Vernona Rieger   Number of children: 3   Years of education: Not on file   Highest education level: Bachelor's degree (e.g., BA, AB, BS)  Occupational History   Not on file  Tobacco Use   Smoking status: Former    Types: Cigars    Quit date: 11/08/2016    Years since quitting: 5.9   Smokeless tobacco: Former    Types: Snuff    Quit date: 11/08/2016   Tobacco  comments:    3/week  Substance and Sexual Activity   Alcohol use: No    Comment: former dependence, none since 2010   Drug use: No   Sexual activity: Not on file  Other Topics Concern   Not on file  Social History Narrative   Lives with wife   Caffeine, coffee 2 cups   Social Determinants of Health   Financial Resource Strain: Not on file  Food Insecurity: Not on file  Transportation Needs: Not on file  Physical Activity: Not on file  Stress: Not on file  Social Connections: Not on file  Intimate Partner Violence: Not on file     PHYSICAL EXAM  GENERAL EXAM/CONSTITUTIONAL: Vitals:  Vitals:   10/08/22 1454  BP: 111/76  Pulse: 63  Weight: 183 lb (83 kg)  Height: 5\' 8"  (1.727 m)   Body mass index is 27.83 kg/m. Wt Readings from Last 3 Encounters:  10/08/22 183 lb (83 kg)  01/28/22 187 lb (84.8 kg)  03/13/21 190 lb (86.2 kg)   Patient is in no distress; well developed, nourished and groomed; neck is supple  CARDIOVASCULAR: Examination of carotid arteries is normal; no carotid bruits Regular rate and rhythm, no murmurs Examination of peripheral vascular system by observation and palpation is normal  EYES: Ophthalmoscopic exam of optic discs and posterior segments is normal; no papilledema or hemorrhages No results found.  MUSCULOSKELETAL: Gait, strength, tone, movements noted in Neurologic exam below  NEUROLOGIC: MENTAL STATUS:      No data to display         awake, alert, oriented to person, place and time recent and remote memory intact normal attention and concentration language fluent, comprehension intact, naming intact fund of knowledge appropriate  CRANIAL NERVE:  2nd - no papilledema on fundoscopic exam 2nd, 3rd, 4th, 6th - pupils equal and reactive to light, visual fields full to confrontation, extraocular muscles intact, no nystagmus 5th - facial sensation symmetric 7th - facial strength symmetric 8th - hearing intact 9th - palate  elevates symmetrically, uvula midline 11th - shoulder shrug symmetric 12th - tongue protrusion midline  MOTOR:  RARE TREMOR IN LUE; MILD BRADYKINESIA IN LUE > LLE; NO COGWHEELING RIGIDITY normal bulk; full strength in the BUE, BLE; EXCEPT SLIGHT RIGHT HF 4/5 WEAKNESS  SENSORY:  normal and symmetric to light touch, temperature, vibration  COORDINATION:  finger-nose-finger, fine finger movements normal  REFLEXES:  deep tendon reflexes TRACE and symmetric  GAIT/STATION:  narrow based gait; MILD DECR LEFT ARM SWING; smooth stride     DIAGNOSTIC DATA (LABS, IMAGING, TESTING) - I reviewed patient records, labs, notes, testing and imaging myself where available.  Lab Results  Component Value Date   WBC  8.3 11/09/2018   HGB 15.7 11/09/2018   HCT 46.1 11/09/2018   MCV 88 11/09/2018   PLT 265 11/09/2018      Component Value Date/Time   NA 144 11/09/2018 1650   K 4.6 11/09/2018 1650   CL 106 11/09/2018 1650   CO2 24 11/09/2018 1650   GLUCOSE 92 11/09/2018 1650   GLUCOSE 108 (H) 05/12/2014 1640   BUN 17 11/09/2018 1650   CREATININE 1.10 11/09/2018 1650   CALCIUM 9.7 11/09/2018 1650   PROT 7.4 11/09/2018 1650   ALBUMIN 4.9 11/09/2018 1650   AST 29 11/09/2018 1650   ALT 52 (H) 11/09/2018 1650   ALKPHOS 47 11/09/2018 1650   BILITOT 0.3 11/09/2018 1650   GFRNONAA 73 11/09/2018 1650   GFRAA 84 11/09/2018 1650   Lab Results  Component Value Date   CHOL 161 03/24/2020   HDL 39 (L) 03/24/2020   LDLCALC 103 (H) 03/24/2020   TRIG 100 03/24/2020   CHOLHDL 4.1 03/24/2020   No results found for: "HGBA1C" No results found for: "VITAMINB12" Lab Results  Component Value Date   TSH 1.800 11/09/2018    11/17/18 MRI brain  - no acute findings - stable right cerebellar linear T2 finding, without associated gliosis or enhancement, likely representing a prominent sulcus.    ASSESSMENT AND PLAN  62 y.o. year old male here with new onset since 2018 of resting tremor,  bradykinesia, cogwheel rigidity, tremor with walking, may be related to idiopathic Parkinson's disease.  Meds tried: carb/levo, rasagiline  Dx:  1. Parkinson's disease with dyskinesia and fluctuating manifestations      PLAN:  TREMOR / BRADYKINESIA / RIGIDITY (idiopathic parkinson's disease) - continue carb/levo 25/100 2 tab three-fours times a day with meals (30 min before meals) - consider rytary, opicapone in future - continue to optimize nutrition, exercise, sleep  Meds ordered this encounter  Medications   carbidopa-levodopa (SINEMET IR) 25-100 MG tablet    Sig: Take 1-2 tablets by mouth 3 (three) times daily before meals.    Dispense:  540 tablet    Refill:  4   Return in about 1 year (around 10/08/2023).    Suanne Marker, MD 10/08/2022, 3:29 PM Certified in Neurology, Neurophysiology and Neuroimaging  [facility] [address], [address] [place], [place] [address] [phone]

## 2023-06-19 ENCOUNTER — Telehealth: Payer: Self-pay | Admitting: Diagnostic Neuroimaging

## 2023-06-19 MED ORDER — CARBIDOPA-LEVODOPA 25-100 MG PO TABS: ORAL_TABLET | ORAL | 0 refills | Status: DC

## 2023-06-19 NOTE — Telephone Encounter (Signed)
 Spoke w/Pt to verify pharmacy location. Pt stated he still has not [place] luggage from the airline. RX for 5 days of med sent to [id number] indicated by Pt.

## 2023-06-19 NOTE — Telephone Encounter (Signed)
 [place], on his flight his luggage was lost, he is asking if 5 days worth of the carbidopa -levodopa  (SINEMET  IR) 25-100 MG tablet can be called into [facility] Store ID: [phone]

## 2023-09-30 ENCOUNTER — Telehealth: Payer: Self-pay | Admitting: Diagnostic Neuroimaging

## 2023-09-30 NOTE — Telephone Encounter (Signed)
 Pt has returned call to [place], [place]

## 2023-09-30 NOTE — Telephone Encounter (Signed)
 Called to reschedule 10/13/23 appt due to MD being out. Patient wanted to let Dr Margaret know that he's taking carbidopa -levodopa  (SINEMET  IR) 25-100 MG tablet every 3 hours instead of 3 times a day for his tremors and is needing a refill

## 2023-09-30 NOTE — Telephone Encounter (Signed)
 Called the patient and there was no answer. According to Dr Chancy notes from last years visit.  TREMOR / BRADYKINESIA / RIGIDITY (idiopathic parkinson's disease) - continue carb/levo 25/100 2 tab three-fours times a day with meals (30 min before meals)  The patient was prescribed the medication taking 2 tablet three (maybe 4 times a day) wanted to touch base and ask how he is truly taking the medication because if he is taking 2 tablet every 3 hrs he is going to run out and also we need to make sure that he isnt taking more than FDA amount. I have asked the pt to call back to get better clarification

## 2023-10-13 ENCOUNTER — Ambulatory Visit: Payer: 59 | Admitting: Diagnostic Neuroimaging

## 2023-12-02 ENCOUNTER — Ambulatory Visit (INDEPENDENT_AMBULATORY_CARE_PROVIDER_SITE_OTHER): Admitting: Diagnostic Neuroimaging

## 2023-12-02 ENCOUNTER — Encounter: Payer: Self-pay | Admitting: Diagnostic Neuroimaging

## 2023-12-02 VITALS — BP 114/78 | HR 62 | Ht 68.0 in | Wt 186.6 lb

## 2023-12-02 DIAGNOSIS — G20B2 Parkinson's disease with dyskinesia, with fluctuations: Secondary | ICD-10-CM | POA: Diagnosis not present

## 2023-12-02 MED ORDER — RYTARY 23.75-95 MG PO CPCR: 4.0000 | ORAL_CAPSULE | Freq: Three times a day (TID) | ORAL | 6 refills | Status: DC

## 2023-12-02 NOTE — Progress Notes (Signed)
 [facility]  PATIENT: Alex Meyer DOB: 1960-05-16  REFERRING CLINICIAN: Marvene Prentice SAUNDERS, FNP  HISTORY FROM: patient and wife REASON FOR VISIT: follow up   HISTORICAL  CHIEF COMPLAINT:  Chief Complaint  Patient presents with   RM 7     Patient is here with wife for Parkinson's follow-up - has more shaking and would like to talk about getting a pump that helps with the shakes. Patient's wife has a picture.     HISTORY OF PRESENT ILLNESS:   UPDATE (12/02/23, VRP): Since last visit, doing using carbidopa  levodopa  2 tablets 3-5 times per day.  Noticed wearing off after about 3 or 3-1/2 hours and therefore was increasing the frequency of dosing.  Sometimes he has some peak dyskinesias where he is having excessive movements of his head and body 1/2 hours after dosage.  Wife asking some questions about diagnosis, prognosis and treatment options including vyalev pump that her friend uses.   UPDATE (10/08/22, VRP): Since last visit, doing about the same with PD. Tried rasagiline  but not much benefit. Around feb 2024 had a fall, right rib injury, left knee pain, neck pain. Managed conservatively by ortho clinic. Some intermittent olfactory hallucinations.   UPDATE (01/28/22, VRP): Since last visit, left hand tremor increased. Balance stable.   UPDATE (03/13/20, VRP): Since last visit, some progression of tremor. On carb/levo 1 tab three times (7am, 11am, 3pm) a day; some wearing off around 4pm.   UPDATE (11/23/18, VRP): Since last visit, doing about the same. Here to followup diagnosis and prognosis and tx options. On carb/levo, but inconsistent. Severity is mild. No alleviating or aggravating factors. Tolerating meds. Some low back pain and left thigh tightness.   PRIOR HPI (11/09/18, VRP): 63 year old male here for evaluation of tremor.  2018 patient noticed resting tremor in left foot and leg. Over the past years this is progressed to his left arm and left hand.  Symptoms  occur mainly at rest when he is not paying attention to it.  Symptoms worse with caffeine and stress.  Patient has family history of tremor in his father.  No change in voice, speech or swallowing.  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  Patient has some low back pain radiating to the right or left legs.  He has noticed some mild balance issues.  No change in smell or taste.  He has some mild constipation.   REVIEW OF SYSTEMS: Full 14 system review of systems performed and negative with exception of: As per HPI.  ALLERGIES: No Known Allergies  HOME MEDICATIONS: Outpatient Medications Prior to Visit  Medication Sig Dispense Refill   carbidopa -levodopa  (SINEMET  IR) 25-100 MG tablet Take 1-2 tablets by mouth 3 (three) times daily before meals. 540 tablet 4   carbidopa -levodopa  (SINEMET  IR) 25-100 MG tablet Take 1-2 tablets 3 (three) times daily before meals 30 tablet 0   fexofenadine (ALLEGRA) 180 MG tablet Take 1 tablet by mouth daily.     Multiple Vitamins-Minerals (MULTIVITAMIN WITH MINERALS) tablet Take 1 tablet by mouth daily.     Melatonin 5 MG TABS Take 5 mg by mouth at bedtime.     No facility-administered medications prior to visit.    PAST MEDICAL HISTORY: Past Medical History:  Diagnosis Date   Excessive daytime sleepiness    Insomnia    Lumbago with sciatica, left side    OSA on CPAP    Parkinson's disease (HCC)  RLS (restless legs syndrome)    pt denies   Tremor     PAST SURGICAL HISTORY: Past Surgical History:  Procedure Laterality Date   APPENDECTOMY  1997    FAMILY HISTORY: Family History  Problem Relation Age of Onset   Allergic rhinitis Father    Tremor Father    Heart failure Mother    Sleep apnea Brother    Colon cancer Maternal Grandfather    Asthma Neg Hx    Angioedema Neg Hx    Eczema Neg Hx    Immunodeficiency Neg Hx    Urticaria Neg Hx     SOCIAL HISTORY: Social History    Socioeconomic History   Marital status: Married    Spouse name: Alex Meyer   Number of children: 3   Years of education: Not on file   Highest education level: Bachelor's degree (e.g., BA, AB, BS)  Occupational History   Not on file  Tobacco Use   Smoking status: Former    Types: Cigars    Quit date: 11/08/2016    Years since quitting: 7.0   Smokeless tobacco: Former    Types: Snuff    Quit date: 11/08/2016   Tobacco comments:    3/week  Substance and Sexual Activity   Alcohol use: No    Comment: former dependence, none since 2010   Drug use: No   Sexual activity: Not on file  Other Topics Concern   Not on file  Social History Narrative   Lives with wife   Caffeine, coffee 2 cups i   Social Drivers of Corporate investment banker Strain: Not on file  Food Insecurity: Not on file  Transportation Needs: Not on file  Physical Activity: Not on file  Stress: Not on file  Social Connections: Not on file  Intimate Partner Violence: Not on file     PHYSICAL EXAM  GENERAL EXAM/CONSTITUTIONAL: Vitals:  Vitals:   12/02/23 1310  BP: 114/78  Pulse: 62  SpO2: 97%  Weight: 186 lb 9.6 oz (84.6 kg)  Height: 5' 8 (1.727 m)   Body mass index is 28.37 kg/m. Wt Readings from Last 3 Encounters:  12/02/23 186 lb 9.6 oz (84.6 kg)  10/08/22 183 lb (83 kg)  01/28/22 187 lb (84.8 kg)   Patient is in no distress; well developed, nourished and groomed; neck is supple  CARDIOVASCULAR: Examination of carotid arteries is normal; no carotid bruits Regular rate and rhythm, no murmurs Examination of peripheral vascular system by observation and palpation is normal  EYES: Ophthalmoscopic exam of optic discs and posterior segments is normal; no papilledema or hemorrhages No results found.  MUSCULOSKELETAL: Gait, strength, tone, movements noted in Neurologic exam below  NEUROLOGIC: MENTAL STATUS:      No data to display         awake, alert, oriented to person, place and  time recent and remote memory intact normal attention and concentration language fluent, comprehension intact, naming intact fund of knowledge appropriate  CRANIAL NERVE:  2nd - no papilledema on fundoscopic exam 2nd, 3rd, 4th, 6th - pupils equal and reactive to light, visual fields full to confrontation, extraocular muscles intact, no nystagmus 5th - facial sensation symmetric 7th - facial strength symmetric 8th - hearing intact 9th - palate elevates symmetrically, uvula midline 11th - shoulder shrug symmetric 12th - tongue protrusion midline  MOTOR:  NO TREMOR; NO BRADYKINESIA; NO RIGIDITY  normal bulk; full strength in the BUE, BLE  SENSORY:  normal and symmetric to light  touch, temperature, vibration  COORDINATION:  finger-nose-finger, fine finger movements normal  REFLEXES:  deep tendon reflexes TRACE and symmetric  GAIT/STATION:  narrow based gait; MILD DECR LEFT ARM SWING; smooth stride     DIAGNOSTIC DATA (LABS, IMAGING, TESTING) - I reviewed patient records, labs, notes, testing and imaging myself where available.  Lab Results  Component Value Date   WBC 8.3 11/09/2018   HGB 15.7 11/09/2018   HCT 46.1 11/09/2018   MCV 88 11/09/2018   PLT 265 11/09/2018      Component Value Date/Time   NA 144 11/09/2018 1650   K 4.6 11/09/2018 1650   CL 106 11/09/2018 1650   CO2 24 11/09/2018 1650   GLUCOSE 92 11/09/2018 1650   GLUCOSE 108 (H) 05/12/2014 1640   BUN 17 11/09/2018 1650   CREATININE 1.10 11/09/2018 1650   CALCIUM 9.7 11/09/2018 1650   PROT 7.4 11/09/2018 1650   ALBUMIN 4.9 11/09/2018 1650   AST 29 11/09/2018 1650   ALT 52 (H) 11/09/2018 1650   ALKPHOS 47 11/09/2018 1650   BILITOT 0.3 11/09/2018 1650   GFRNONAA 73 11/09/2018 1650   GFRAA 84 11/09/2018 1650   Lab Results  Component Value Date   CHOL 161 03/24/2020   HDL 39 (L) 03/24/2020   LDLCALC 103 (H) 03/24/2020   TRIG 100 03/24/2020   CHOLHDL 4.1 03/24/2020   No results found for:  HGBA1C No results found for: VITAMINB12 Lab Results  Component Value Date   TSH 1.800 11/09/2018    11/17/18 MRI brain  - no acute findings - stable right cerebellar linear T2 finding, without associated gliosis or enhancement, likely representing a prominent sulcus.    ASSESSMENT AND PLAN  64 y.o. year old male here with new onset since 2018 of resting tremor, bradykinesia, cogwheel rigidity, tremor with walking, may be related to idiopathic Parkinson's disease.  Meds tried: carb/levo, rasagiline   Dx:  1. Parkinson's disease with dyskinesia and fluctuating manifestations (HCC)     PLAN:  TREMOR / BRADYKINESIA / RIGIDITY (idiopathic parkinson's disease) - change carb/levo to rytary  23.75 / 95 (4 caps three times a day) due to peak dyskinesia and wearing off (after 3 hours) - consider opicapone in future - continue to optimize nutrition, exercise, sleep  Meds ordered this encounter  Medications   Carbidopa -Levodopa  ER (RYTARY ) 23.75-95 MG CPCR    Sig: Take 4 capsules by mouth 3 (three) times daily.    Dispense:  360 capsule    Refill:  6   Return in about 8 months (around 08/01/2024).    EDUARD FABIENE HANLON, MD 12/02/2023, 2:21 PM Certified in Neurology, Neurophysiology and Neuroimaging  [facility] [address], [address] [place], [place] [address] [phone]

## 2023-12-04 ENCOUNTER — Encounter: Payer: Self-pay | Admitting: Diagnostic Neuroimaging

## 2023-12-09 MED ORDER — ENTACAPONE 200 MG PO TABS: 200.0000 mg | ORAL_TABLET | Freq: Three times a day (TID) | ORAL | 12 refills | Status: AC

## 2024-01-01 ENCOUNTER — Other Ambulatory Visit: Payer: Self-pay | Admitting: Diagnostic Neuroimaging

## 2024-01-05 MED ORDER — CARBIDOPA-LEVODOPA 25-100 MG PO TABS: 1.0000 | ORAL_TABLET | Freq: Three times a day (TID) | ORAL | 0 refills | Status: AC

## 2024-01-05 NOTE — Telephone Encounter (Signed)
 Patient said Checking status of refill. Completely out of medication for carbidopa -levodopa  (SINEMET  IR) 25-100 MG tablet (Expired. Need 4 days sent until medication arrive by mail order from [facility]. Please to send to:  [id number]   Would like a call back.

## 2024-08-03 ENCOUNTER — Ambulatory Visit: Admitting: Diagnostic Neuroimaging
